# Patient Record
Sex: Female | Born: 1940
Health system: Southern US, Community
[De-identification: ages and names within clinical notes are randomized; demographics above are authoritative.]

## PROBLEM LIST (undated history)

## (undated) DIAGNOSIS — N6029 Fibroadenosis of unspecified breast: Secondary | ICD-10-CM

## (undated) DIAGNOSIS — B009 Herpesviral infection, unspecified: Secondary | ICD-10-CM

## (undated) DIAGNOSIS — M75120 Complete rotator cuff tear or rupture of unspecified shoulder, not specified as traumatic: Secondary | ICD-10-CM

## (undated) DIAGNOSIS — M7500 Adhesive capsulitis of unspecified shoulder: Secondary | ICD-10-CM

## (undated) DIAGNOSIS — M542 Cervicalgia: Secondary | ICD-10-CM

## (undated) DIAGNOSIS — R079 Chest pain, unspecified: Secondary | ICD-10-CM

## (undated) DIAGNOSIS — H6120 Impacted cerumen, unspecified ear: Secondary | ICD-10-CM

## (undated) DIAGNOSIS — G709 Myoneural disorder, unspecified: Secondary | ICD-10-CM

## (undated) DIAGNOSIS — N6011 Diffuse cystic mastopathy of right breast: Secondary | ICD-10-CM

## (undated) DIAGNOSIS — M545 Low back pain: Secondary | ICD-10-CM

## (undated) DIAGNOSIS — M25562 Pain in left knee: Secondary | ICD-10-CM

## (undated) DIAGNOSIS — M7501 Adhesive capsulitis of right shoulder: Secondary | ICD-10-CM

## (undated) DIAGNOSIS — R7303 Prediabetes: Secondary | ICD-10-CM

## (undated) DIAGNOSIS — B0229 Other postherpetic nervous system involvement: Secondary | ICD-10-CM

## (undated) DIAGNOSIS — M25511 Pain in right shoulder: Secondary | ICD-10-CM

## (undated) DIAGNOSIS — Z66 Do not resuscitate: Secondary | ICD-10-CM

## (undated) DIAGNOSIS — N6012 Diffuse cystic mastopathy of left breast: Secondary | ICD-10-CM

## (undated) DIAGNOSIS — R2681 Unsteadiness on feet: Secondary | ICD-10-CM

## (undated) DIAGNOSIS — M2170 Unequal limb length (acquired), unspecified site: Secondary | ICD-10-CM

## (undated) DIAGNOSIS — J302 Other seasonal allergic rhinitis: Secondary | ICD-10-CM

## (undated) DIAGNOSIS — J019 Acute sinusitis, unspecified: Secondary | ICD-10-CM

## (undated) DIAGNOSIS — L309 Dermatitis, unspecified: Secondary | ICD-10-CM

## (undated) HISTORY — DX: Cervicalgia: M54.2

## (undated) HISTORY — PX: ROTATOR CUFF REPAIR: SHX139

## (undated) HISTORY — DX: Fibroadenosis of unspecified breast: N60.29

## (undated) HISTORY — DX: Complete rotator cuff tear or rupture of unspecified shoulder, not specified as traumatic: M75.120

## (undated) HISTORY — DX: Adhesive capsulitis of unspecified shoulder: M75.00

## (undated) HISTORY — DX: Pain in left knee: M25.562

## (undated) HISTORY — DX: Impacted cerumen, unspecified ear: H61.20

## (undated) HISTORY — DX: Diffuse cystic mastopathy of left breast: N60.12

## (undated) HISTORY — DX: Dermatitis, unspecified: L30.9

## (undated) HISTORY — DX: Low back pain: M54.5

## (undated) HISTORY — DX: Adhesive capsulitis of right shoulder: M75.01

## (undated) HISTORY — DX: Do not resuscitate: Z66

## (undated) HISTORY — DX: Other seasonal allergic rhinitis: J30.2

## (undated) HISTORY — DX: Diffuse cystic mastopathy of right breast: N60.11

## (undated) HISTORY — DX: Chest pain, unspecified: R07.9

## (undated) HISTORY — DX: Acute sinusitis, unspecified: J01.90

## (undated) HISTORY — PX: ENDOMETRIAL BIOPSY: SHX622

## (undated) HISTORY — DX: Herpesviral infection, unspecified: B00.9

## (undated) HISTORY — DX: Other postherpetic nervous system involvement: B02.29

## (undated) HISTORY — DX: Unsteadiness on feet: R26.81

## (undated) HISTORY — DX: Unequal limb length (acquired), unspecified site: M21.70

## (undated) HISTORY — DX: Pain in right shoulder: M25.511

---
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
## 1997-01-05 HISTORY — PX: BREAST CYST ASPIRATION: SHX578

## 1997-07-03 ENCOUNTER — Encounter: Admission: RE | Admit: 1997-07-03 | Discharge: 1997-07-03 | Payer: Self-pay | Admitting: Family Medicine

## 1997-12-26 ENCOUNTER — Encounter: Admission: RE | Admit: 1997-12-26 | Discharge: 1997-12-26 | Payer: Self-pay | Admitting: Sports Medicine

## 1998-01-07 ENCOUNTER — Encounter: Admission: RE | Admit: 1998-01-07 | Discharge: 1998-01-07 | Payer: Self-pay | Admitting: Family Medicine

## 1998-07-08 ENCOUNTER — Encounter: Admission: RE | Admit: 1998-07-08 | Discharge: 1998-07-08 | Payer: Self-pay | Admitting: Family Medicine

## 1998-08-08 ENCOUNTER — Encounter: Admission: RE | Admit: 1998-08-08 | Discharge: 1998-08-08 | Payer: Self-pay | Admitting: Family Medicine

## 1998-10-29 ENCOUNTER — Encounter: Admission: RE | Admit: 1998-10-29 | Discharge: 1998-10-29 | Payer: Self-pay | Admitting: Sports Medicine

## 1999-07-15 ENCOUNTER — Encounter: Admission: RE | Admit: 1999-07-15 | Discharge: 1999-07-15 | Payer: Self-pay | Admitting: Family Medicine

## 1999-07-17 ENCOUNTER — Encounter: Payer: Self-pay | Admitting: Family Medicine

## 1999-07-17 ENCOUNTER — Encounter: Admission: RE | Admit: 1999-07-17 | Discharge: 1999-07-17 | Payer: Self-pay | Admitting: Family Medicine

## 2000-08-17 ENCOUNTER — Encounter: Admission: RE | Admit: 2000-08-17 | Discharge: 2000-08-17 | Payer: Self-pay | Admitting: Family Medicine

## 2000-11-10 ENCOUNTER — Encounter: Admission: RE | Admit: 2000-11-10 | Discharge: 2000-11-10 | Payer: Self-pay | Admitting: Family Medicine

## 2001-01-26 ENCOUNTER — Encounter: Admission: RE | Admit: 2001-01-26 | Discharge: 2001-01-26 | Payer: Self-pay | Admitting: Family Medicine

## 2001-01-31 ENCOUNTER — Encounter: Payer: Self-pay | Admitting: Family Medicine

## 2001-01-31 ENCOUNTER — Encounter: Admission: RE | Admit: 2001-01-31 | Discharge: 2001-01-31 | Payer: Self-pay | Admitting: Family Medicine

## 2001-12-09 ENCOUNTER — Encounter: Admission: RE | Admit: 2001-12-09 | Discharge: 2001-12-09 | Payer: Self-pay | Admitting: Family Medicine

## 2002-03-08 ENCOUNTER — Encounter: Admission: RE | Admit: 2002-03-08 | Discharge: 2002-03-08 | Payer: Self-pay | Admitting: Family Medicine

## 2002-03-09 ENCOUNTER — Encounter: Admission: RE | Admit: 2002-03-09 | Discharge: 2002-03-09 | Payer: Self-pay | Admitting: Family Medicine

## 2002-03-09 ENCOUNTER — Encounter: Payer: Self-pay | Admitting: Family Medicine

## 2002-03-16 ENCOUNTER — Encounter: Payer: Self-pay | Admitting: Family Medicine

## 2002-03-16 ENCOUNTER — Encounter: Admission: RE | Admit: 2002-03-16 | Discharge: 2002-03-16 | Payer: Self-pay | Admitting: Family Medicine

## 2002-09-26 ENCOUNTER — Encounter: Admission: RE | Admit: 2002-09-26 | Discharge: 2002-09-26 | Payer: Self-pay | Admitting: Family Medicine

## 2002-09-26 ENCOUNTER — Encounter: Payer: Self-pay | Admitting: Sports Medicine

## 2002-09-26 ENCOUNTER — Encounter: Admission: RE | Admit: 2002-09-26 | Discharge: 2002-09-26 | Payer: Self-pay | Admitting: Sports Medicine

## 2003-05-10 ENCOUNTER — Encounter: Admission: RE | Admit: 2003-05-10 | Discharge: 2003-05-10 | Payer: Self-pay | Admitting: Sports Medicine

## 2003-05-10 ENCOUNTER — Encounter: Admission: RE | Admit: 2003-05-10 | Discharge: 2003-05-10 | Payer: Self-pay | Admitting: Family Medicine

## 2003-09-06 ENCOUNTER — Encounter (INDEPENDENT_AMBULATORY_CARE_PROVIDER_SITE_OTHER): Payer: Self-pay | Admitting: *Deleted

## 2003-09-06 LAB — CONVERTED CEMR LAB

## 2003-09-18 ENCOUNTER — Encounter: Payer: Self-pay | Admitting: Family Medicine

## 2003-09-18 ENCOUNTER — Ambulatory Visit: Payer: Self-pay | Admitting: Family Medicine

## 2003-09-19 LAB — CONVERTED CEMR LAB: TSH: 1.53 microintl units/mL

## 2003-09-28 ENCOUNTER — Encounter: Admission: RE | Admit: 2003-09-28 | Discharge: 2003-09-28 | Payer: Self-pay | Admitting: Sports Medicine

## 2003-10-09 ENCOUNTER — Ambulatory Visit: Payer: Self-pay | Admitting: Family Medicine

## 2003-10-11 ENCOUNTER — Ambulatory Visit: Payer: Self-pay | Admitting: Family Medicine

## 2003-12-06 HISTORY — PX: COLONOSCOPY W/ BIOPSIES AND POLYPECTOMY: SHX1376

## 2003-12-07 ENCOUNTER — Ambulatory Visit: Payer: Self-pay | Admitting: Family Medicine

## 2003-12-18 ENCOUNTER — Ambulatory Visit (HOSPITAL_COMMUNITY): Admission: RE | Admit: 2003-12-18 | Discharge: 2003-12-18 | Payer: Self-pay | Admitting: Gastroenterology

## 2003-12-18 ENCOUNTER — Encounter (INDEPENDENT_AMBULATORY_CARE_PROVIDER_SITE_OTHER): Payer: Self-pay | Admitting: *Deleted

## 2004-10-14 ENCOUNTER — Ambulatory Visit: Payer: Self-pay | Admitting: Family Medicine

## 2005-11-18 ENCOUNTER — Ambulatory Visit: Payer: Self-pay | Admitting: Family Medicine

## 2006-02-09 ENCOUNTER — Other Ambulatory Visit: Admission: RE | Admit: 2006-02-09 | Discharge: 2006-02-09 | Payer: Self-pay | Admitting: Family Medicine

## 2006-02-09 ENCOUNTER — Ambulatory Visit: Payer: Self-pay | Admitting: Family Medicine

## 2006-02-09 ENCOUNTER — Encounter: Payer: Self-pay | Admitting: Family Medicine

## 2006-02-09 LAB — CONVERTED CEMR LAB
ALT: 28 units/L (ref 0–35)
AST: 24 units/L (ref 0–37)
Alkaline Phosphatase: 102 units/L (ref 39–117)
LDL Cholesterol: 117 mg/dL — ABNORMAL HIGH (ref 0–99)
Potassium: 4.4 meq/L (ref 3.5–5.3)
Sodium: 140 meq/L (ref 135–145)
Total Bilirubin: 0.5 mg/dL (ref 0.3–1.2)
Total Protein: 7.6 g/dL (ref 6.0–8.3)
Triglycerides: 244 mg/dL — ABNORMAL HIGH (ref <150)
VLDL: 49 mg/dL — ABNORMAL HIGH (ref 0–40)

## 2006-03-04 DIAGNOSIS — G609 Hereditary and idiopathic neuropathy, unspecified: Secondary | ICD-10-CM

## 2006-03-04 DIAGNOSIS — J329 Chronic sinusitis, unspecified: Secondary | ICD-10-CM | POA: Insufficient documentation

## 2006-03-04 DIAGNOSIS — N951 Menopausal and female climacteric states: Secondary | ICD-10-CM

## 2006-03-04 DIAGNOSIS — N6029 Fibroadenosis of unspecified breast: Secondary | ICD-10-CM

## 2006-03-04 DIAGNOSIS — E669 Obesity, unspecified: Secondary | ICD-10-CM | POA: Insufficient documentation

## 2006-03-04 HISTORY — DX: Fibroadenosis of unspecified breast: N60.29

## 2006-03-05 ENCOUNTER — Encounter (INDEPENDENT_AMBULATORY_CARE_PROVIDER_SITE_OTHER): Payer: Self-pay | Admitting: *Deleted

## 2006-03-31 ENCOUNTER — Encounter: Admission: RE | Admit: 2006-03-31 | Discharge: 2006-03-31 | Payer: Self-pay | Admitting: Sports Medicine

## 2006-03-31 ENCOUNTER — Encounter: Payer: Self-pay | Admitting: Family Medicine

## 2006-12-15 ENCOUNTER — Ambulatory Visit: Payer: Self-pay | Admitting: Family Medicine

## 2007-02-10 ENCOUNTER — Encounter: Payer: Self-pay | Admitting: Family Medicine

## 2007-02-10 LAB — CONVERTED CEMR LAB: Pap Smear: NORMAL

## 2007-02-15 ENCOUNTER — Telehealth: Payer: Self-pay | Admitting: Family Medicine

## 2007-04-26 ENCOUNTER — Ambulatory Visit: Payer: Self-pay | Admitting: Family Medicine

## 2007-04-26 ENCOUNTER — Encounter: Payer: Self-pay | Admitting: Family Medicine

## 2007-04-26 DIAGNOSIS — M899 Disorder of bone, unspecified: Secondary | ICD-10-CM | POA: Insufficient documentation

## 2007-04-26 DIAGNOSIS — M949 Disorder of cartilage, unspecified: Secondary | ICD-10-CM

## 2007-04-26 LAB — CONVERTED CEMR LAB
ALT: 24 units/L (ref 0–35)
AST: 20 units/L (ref 0–37)
Calcium: 9.3 mg/dL (ref 8.4–10.5)
Chloride: 104 meq/L (ref 96–112)
Creatinine, Ser: 0.6 mg/dL (ref 0.40–1.20)
HCT: 40.5 % (ref 36.0–46.0)
Platelets: 308 10*3/uL (ref 150–400)
RDW: 13.5 % (ref 11.5–15.5)
Sodium: 143 meq/L (ref 135–145)

## 2007-04-27 ENCOUNTER — Encounter: Payer: Self-pay | Admitting: Family Medicine

## 2007-05-02 ENCOUNTER — Encounter: Admission: RE | Admit: 2007-05-02 | Discharge: 2007-05-02 | Payer: Self-pay | Admitting: Family Medicine

## 2007-05-02 ENCOUNTER — Encounter: Payer: Self-pay | Admitting: Family Medicine

## 2007-05-04 ENCOUNTER — Encounter: Payer: Self-pay | Admitting: Family Medicine

## 2007-06-20 ENCOUNTER — Encounter: Admission: RE | Admit: 2007-06-20 | Discharge: 2007-06-20 | Payer: Self-pay | Admitting: Family Medicine

## 2007-08-29 ENCOUNTER — Telehealth (INDEPENDENT_AMBULATORY_CARE_PROVIDER_SITE_OTHER): Payer: Self-pay | Admitting: *Deleted

## 2007-09-14 ENCOUNTER — Telehealth: Payer: Self-pay | Admitting: Family Medicine

## 2007-09-15 ENCOUNTER — Ambulatory Visit: Payer: Self-pay | Admitting: Family Medicine

## 2007-09-15 DIAGNOSIS — B029 Zoster without complications: Secondary | ICD-10-CM | POA: Insufficient documentation

## 2007-09-19 ENCOUNTER — Telehealth: Payer: Self-pay | Admitting: Family Medicine

## 2007-09-26 ENCOUNTER — Encounter: Payer: Self-pay | Admitting: Family Medicine

## 2007-10-04 ENCOUNTER — Telehealth: Payer: Self-pay | Admitting: Family Medicine

## 2007-11-10 ENCOUNTER — Ambulatory Visit: Payer: Self-pay | Admitting: Family Medicine

## 2007-11-10 DIAGNOSIS — B0229 Other postherpetic nervous system involvement: Secondary | ICD-10-CM

## 2007-11-10 DIAGNOSIS — S93409A Sprain of unspecified ligament of unspecified ankle, initial encounter: Secondary | ICD-10-CM | POA: Insufficient documentation

## 2007-11-10 HISTORY — DX: Other postherpetic nervous system involvement: B02.29

## 2007-11-10 LAB — CONVERTED CEMR LAB
Bilirubin Urine: NEGATIVE
Ketones, urine, test strip: NEGATIVE
Protein, U semiquant: NEGATIVE
Urobilinogen, UA: 0.2

## 2007-11-14 ENCOUNTER — Encounter: Payer: Self-pay | Admitting: Family Medicine

## 2008-12-18 ENCOUNTER — Ambulatory Visit: Payer: Self-pay | Admitting: Family Medicine

## 2009-03-14 ENCOUNTER — Encounter: Admission: RE | Admit: 2009-03-14 | Discharge: 2009-03-14 | Payer: Self-pay | Admitting: Family Medicine

## 2009-04-24 ENCOUNTER — Encounter: Payer: Self-pay | Admitting: Family Medicine

## 2010-01-26 ENCOUNTER — Encounter: Payer: Self-pay | Admitting: Family Medicine

## 2010-02-04 NOTE — Consult Note (Signed)
[facility]  [facility]   Imported By: Clydell Hakim 05/02/2009 14:29:04  _____________________________________________________________________  External Attachment:    Type:   Image     Comment:   External Document  Appended Document: [facility]    Clinical Lists Changes  Observations: Added new observation of COLONRECACT: Repeat colonoscopy in 5 years.  (04/09/2009 12:06) Added new observation of COLONOSCOPY: Results: Hemorrhoids.     Results: Diverticulosis.       Location:  [facility]. Dr Ewing Schlein   (04/09/2009 12:06)       Colonoscopy  Procedure date:  04/09/2009  Findings:      Results: Hemorrhoids.     Results: Diverticulosis.       Location:  [facility]. Dr Ewing Schlein    Comments:      Repeat colonoscopy in 5 years.    Colonoscopy  Procedure date:  04/09/2009  Findings:      Results: Hemorrhoids.     Results: Diverticulosis.       Location:  [facility]. Dr Ewing Schlein    Comments:      Repeat colonoscopy in 5 years.

## 2010-03-17 ENCOUNTER — Encounter: Payer: Self-pay | Admitting: Home Health Services

## 2010-05-23 NOTE — Op Note (Signed)
NAMEANSLEY, MANGIAPANE              ACCOUNT NO.:  [id number]   MEDICAL RECORD NO.:  [id number]          PATIENT TYPE:  AMB   LOCATION:  ENDO                         FACILITY:  [facility]   PHYSICIAN:  Petra Kuba, M.D.    DATE OF BIRTH:  1940/11/18   DATE OF PROCEDURE:  12/18/2003  DATE OF DISCHARGE:                                 OPERATIVE REPORT   PROCEDURE:  Colonoscopy with polypectomy.   INDICATION:  Some mild change in bowel habits.  Due for colonic screening.  Consent was signed after risks, benefits, methods, options thoroughly  discussed in the office.   MEDICINES USED:  1.  Demerol 50.  2.  Versed 4.   DESCRIPTION OF PROCEDURE:  Rectal inspection was pertinent for external  hemorrhoids.  Digital exam was negative.  Video pediatric adjustable  colonoscope was inserted and with abdominal pressure easily advanced around  the colon to the cecum.  On insertion, small descending polyp was seen as  well as some left-sided diverticula but no other abnormalities.  The cecum  was identified by the appendiceal orifice and the ileocecal valve.  The  scope was slowly withdrawn.  The prep was adequate.  There was some liquid  stool that required washing and suctioning.  On slow withdrawal through the  colon, the cecum, ascending, and transverse was normal.  As the scope was  withdrawn around the left side of the colon, occasional scattered  diverticula were seen.  Polyp seen on insertion was found, snared x 2.  There still seemed to be a little residual polypoid tissue remaining, so it  was hot biopsied x 1.  Pieces removed were suctioned through the scope and  collected in the trap.  The scope was further withdrawn.  One other tiny  sigmoid polyp was seen and was hot biopsied x 1 and put in the same  container.  Once back in the rectum, anorectal pull-through and retroflexion  confirmed some small hemorrhoids.  The scope was straightened and readvanced  a short ways up the left side of  the colon; air was suctioned, scope  removed.  The patient tolerated the procedure well.  There was no obvious  immediate complication.   ENDOSCOPIC DIAGNOSES:  1.  Internal/external hemorrhoids.  2.  Left-sided scattered diverticula.  3.  One tiny sigmoid polyp, hot biopsied.  4.  One small descending polyp, snared x 2 and hot biopsied x 1.  5.  Otherwise, within normal limits to the cecum.   PLAN:  1.  Await pathology to determine future colonic screening.  Probably recheck      in 5 years.  2.  Happy to see back p.r.n.  3.  Otherwise, return to Dr. Sheffield Slider for the customary health care maintenance      to include yearly rectals and guaiacs.      MEM/MEDQ  D:  12/18/2003  T:  12/18/2003  Job:  [id number]   cc:   Deniece Portela A. Sheffield Slider, M.D.  Fax: [phone]

## 2010-05-31 ENCOUNTER — Other Ambulatory Visit: Payer: Self-pay | Admitting: Family Medicine

## 2010-06-02 NOTE — Telephone Encounter (Signed)
Refill request

## 2010-06-29 ENCOUNTER — Other Ambulatory Visit: Payer: Self-pay | Admitting: Family Medicine

## 2010-06-29 NOTE — Telephone Encounter (Signed)
Refill request

## 2010-08-27 ENCOUNTER — Other Ambulatory Visit: Payer: Self-pay | Admitting: Family Medicine

## 2010-08-27 NOTE — Telephone Encounter (Signed)
Refill request

## 2011-06-20 ENCOUNTER — Other Ambulatory Visit: Payer: Self-pay | Admitting: Family Medicine

## 2011-07-14 ENCOUNTER — Encounter: Payer: Self-pay | Admitting: Family Medicine

## 2011-07-29 ENCOUNTER — Other Ambulatory Visit: Payer: Self-pay | Admitting: Family Medicine

## 2011-07-30 NOTE — Telephone Encounter (Signed)
She needs an office visit before further refills

## 2011-08-04 ENCOUNTER — Encounter: Payer: Self-pay | Admitting: Family Medicine

## 2011-08-04 ENCOUNTER — Ambulatory Visit (INDEPENDENT_AMBULATORY_CARE_PROVIDER_SITE_OTHER): Payer: Medicare Other | Admitting: Family Medicine

## 2011-08-04 VITALS — BP 143/85 | HR 81 | Ht 62.0 in | Wt 164.0 lb

## 2011-08-04 DIAGNOSIS — B0229 Other postherpetic nervous system involvement: Secondary | ICD-10-CM

## 2011-08-04 DIAGNOSIS — M25562 Pain in left knee: Secondary | ICD-10-CM

## 2011-08-04 DIAGNOSIS — M1712 Unilateral primary osteoarthritis, left knee: Secondary | ICD-10-CM

## 2011-08-04 DIAGNOSIS — M25519 Pain in unspecified shoulder: Secondary | ICD-10-CM

## 2011-08-04 DIAGNOSIS — M899 Disorder of bone, unspecified: Secondary | ICD-10-CM

## 2011-08-04 DIAGNOSIS — M25569 Pain in unspecified knee: Secondary | ICD-10-CM

## 2011-08-04 DIAGNOSIS — M949 Disorder of cartilage, unspecified: Secondary | ICD-10-CM

## 2011-08-04 HISTORY — DX: Unilateral primary osteoarthritis, left knee: M17.12

## 2011-08-04 HISTORY — DX: Pain in left knee: M25.562

## 2011-08-04 MED ORDER — IBUPROFEN 600 MG PO TABS: 600.0000 mg | ORAL_TABLET | Freq: Three times a day (TID) | ORAL | Status: AC | PRN

## 2011-08-04 MED ORDER — GABAPENTIN 600 MG PO TABS: 600.0000 mg | ORAL_TABLET | Freq: Three times a day (TID) | ORAL | Status: DC

## 2011-08-04 MED ORDER — GABAPENTIN 800 MG PO TABS: 800.0000 mg | ORAL_TABLET | Freq: Every day | ORAL | Status: DC

## 2011-08-04 NOTE — Patient Instructions (Addendum)
Please call to arrange xrays of your knee if it isn't improved after the Ibuprofen course  Schedule fasting lab tests. I'll let you know the results.   Schedule an annual Medicare evaluation with Rosalita Chessman.

## 2011-08-05 ENCOUNTER — Encounter: Payer: Self-pay | Admitting: Family Medicine

## 2011-08-05 NOTE — Assessment & Plan Note (Addendum)
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 I will send her an article about stretching exercises for this syndrome Ibuprofen course. If no improvement, iliotibial band exercises

## 2011-08-05 NOTE — Progress Notes (Signed)
  Subjective:    Patient ID: SCARLET ABAD, female    DOB: 1940/01/23, 71 y.o.   MRN: [id number]  HPI left knee - hurting lateral side x 4 weeks. No trauma or overuse. Worse with squatting or doing steps  right shoulder - hurting with movement last 3 months. No history of overuse or injury. Doesn't awaken her at night.   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   Peripheral neuropathy - continues discomfort Review of Systems     Objective:   Physical Exam  Cardiovascular: Normal rate and regular rhythm.   Pulmonary/Chest: Effort normal and breath sounds normal.  Musculoskeletal:       left knee tender proximal to anterolateral joint line and beneath both sides of patella. No crepitus or locking. No effusion. Pain reproduced with full flexion, but McMurray's is negative.   right shoulder has full range of motion without pain. Hawkins positive for pain and tender over right AC joint. No biceps or subacromial tenderness and negative impingement signs          Assessment & Plan:

## 2011-08-05 NOTE — Assessment & Plan Note (Signed)
Mild currently. Return for steroid injection if not improved on Ibuprofen

## 2011-08-06 ENCOUNTER — Other Ambulatory Visit: Payer: Self-pay | Admitting: Family Medicine

## 2011-08-06 ENCOUNTER — Encounter: Payer: Self-pay | Admitting: Home Health Services

## 2011-08-06 ENCOUNTER — Ambulatory Visit (INDEPENDENT_AMBULATORY_CARE_PROVIDER_SITE_OTHER): Payer: Medicare Other | Admitting: Home Health Services

## 2011-08-06 VITALS — BP 127/82 | HR 68 | Temp 97.9°F | Ht 61.0 in | Wt 164.2 lb

## 2011-08-06 DIAGNOSIS — Z23 Encounter for immunization: Secondary | ICD-10-CM

## 2011-08-06 DIAGNOSIS — Z0000 Encounter for general adult medical examination without abnormal findings: Secondary | ICD-10-CM

## 2011-08-06 NOTE — Assessment & Plan Note (Signed)
Tolerable on the Gabapentin

## 2011-08-06 NOTE — Assessment & Plan Note (Signed)
Will schedule BMD

## 2011-08-06 NOTE — Progress Notes (Signed)
Patient here for annual wellness visit, patient reports: Risk Factors/Conditions needing evaluation or treatment: Pt has no new risk factors that need's evaluation.  Home Safety: Pt lives with husband in 3 story home. Pt reports having smoke detectors and adaptive equipment in bathroom. Other Information: Corrective lens: Pt wears daily corrective lens.  Visits eye dr annually. Dentures: Pt does not have dentures. Visits dentist annually. Memory: Pt denies memory problems. Patient's Mini Mental Score (recorded in doc. flowsheet): 29  Balance/Gait: Pt does not have any noticeable impairment.  Balance Abnormal Patient value  Sitting balance    Sit to stand    Attempts to arise    Immediate standing balance    Standing balance    Nudge    Eyes closed- Romberg    Tandem stance    Back lean    Neck Rotation    360 degree turn    Sitting down     Gait Abnormal Patient value  Initiation of gait    Step length-left    Step length-right    Step height-left    Step height-right    Step symmetry    Step continuity    Path deviation    Trunk movement    Walking stance        Annual Wellness Visit Requirements Recorded Today In  Medical, family, social history Past Medical, Family, Social History Section  Current providers Care team  Current medications Medications  Wt, BP, Ht, BMI Vital signs  Hearing assessment (welcome visit) Hearing/vision.   Tobacco, alcohol, illicit drug use History  ADL Nurse Assessment  Depression Screening Nurse Assessment  Cognitive impairment Nurse Assessment  Mini Mental Status Document Flowsheet  Fall Risk Nurse Assessment  Home Safety Progress Note  End of Life Planning (welcome visit) Social Documentation  Medicare preventative services Progress Note  Risk factors/conditions needing evaluation/treatment Progress Note  Personalized health advice Patient Instructions, goals, letter  Diet & Exercise Social Documentation  Emergency Contact  Social Documentation  Seat Belts Social Documentation  Sun exposure/protection Social Documentation    Prevention Plan:   Recommended Medicare Prevention Screenings Women over 65 Test For Frequency Date of Last- BOLD if needed  Breast Cancer 1-2 yrs 3/11- discussed scheduling appt  Cervical Cancer 1-3 yrs NI-due to age  Colorectal Cancer 1-10 yrs 4/05  Osteoporosis once 4/09- will schedule re-visit  Cholesterol 5 yrs 4/09- is scheduled to come back  Diabetes yearly 4/09-is scheduled to come back  HIV yearly declined  Influenza Shot yearly 12/10- will come back in the fall  Pneumonia Shot once 11/05- was done today   Zostavax Shot once discussed, will talk to pharmacist   I have reviewed this visit and discussed with Arlys John and agree with her documentation. Wayne A. Sheffield Slider, MD

## 2011-08-06 NOTE — Patient Instructions (Signed)
1. Schedule mammogram. 2. Schedule bone dentist screening. 3. Consider getting shingles vaccine. 4. Follow up with getting blood work done here at [facility]. 5. Be sure to get your flu vaccine starting in September 2013.  6. Consider focusing in eating 3-5 fruits and vegetables a day. 7. Consider looking into Silver Sneakers program at [facility] for exercise.

## 2011-08-07 ENCOUNTER — Other Ambulatory Visit: Payer: Self-pay | Admitting: Family Medicine

## 2011-08-07 ENCOUNTER — Telehealth: Payer: Self-pay | Admitting: Family Medicine

## 2011-08-07 ENCOUNTER — Encounter: Payer: Self-pay | Admitting: Home Health Services

## 2011-08-07 DIAGNOSIS — Z1231 Encounter for screening mammogram for malignant neoplasm of breast: Secondary | ICD-10-CM

## 2011-08-07 DIAGNOSIS — M8580 Other specified disorders of bone density and structure, unspecified site: Secondary | ICD-10-CM

## 2011-08-07 NOTE — Telephone Encounter (Signed)
Patient needs an [facility]  Her appt is for 8/15 so they will need it by then.

## 2011-08-07 NOTE — Telephone Encounter (Signed)
Done. Faxed. .Kynnedy Carreno  

## 2011-08-20 ENCOUNTER — Ambulatory Visit
Admission: RE | Admit: 2011-08-20 | Discharge: 2011-08-20 | Disposition: A | Discharge status: Home / Self Care | Payer: Medicare Other | Source: Ambulatory Visit | Attending: Family Medicine | Admitting: Family Medicine

## 2011-08-20 ENCOUNTER — Encounter: Payer: Self-pay | Admitting: Family Medicine

## 2011-08-20 ENCOUNTER — Ambulatory Visit (INDEPENDENT_AMBULATORY_CARE_PROVIDER_SITE_OTHER): Payer: Medicare Other | Admitting: Family Medicine

## 2011-08-20 VITALS — BP 114/76 | HR 81 | Ht 61.0 in | Wt 164.0 lb

## 2011-08-20 DIAGNOSIS — M899 Disorder of bone, unspecified: Secondary | ICD-10-CM

## 2011-08-20 DIAGNOSIS — E781 Pure hyperglyceridemia: Secondary | ICD-10-CM

## 2011-08-20 DIAGNOSIS — B009 Herpesviral infection, unspecified: Secondary | ICD-10-CM | POA: Insufficient documentation

## 2011-08-20 DIAGNOSIS — M25562 Pain in left knee: Secondary | ICD-10-CM

## 2011-08-20 DIAGNOSIS — L988 Other specified disorders of the skin and subcutaneous tissue: Secondary | ICD-10-CM

## 2011-08-20 DIAGNOSIS — M8580 Other specified disorders of bone density and structure, unspecified site: Secondary | ICD-10-CM

## 2011-08-20 DIAGNOSIS — R238 Other skin changes: Secondary | ICD-10-CM

## 2011-08-20 DIAGNOSIS — Z1231 Encounter for screening mammogram for malignant neoplasm of breast: Secondary | ICD-10-CM

## 2011-08-20 DIAGNOSIS — M949 Disorder of cartilage, unspecified: Secondary | ICD-10-CM

## 2011-08-20 DIAGNOSIS — M25569 Pain in unspecified knee: Secondary | ICD-10-CM

## 2011-08-20 DIAGNOSIS — M25519 Pain in unspecified shoulder: Secondary | ICD-10-CM

## 2011-08-20 HISTORY — DX: Pure hyperglyceridemia: E78.1

## 2011-08-20 HISTORY — DX: Herpesviral infection, unspecified: B00.9

## 2011-08-20 LAB — CBC
HCT: 39.6 % (ref 36.0–46.0)
MCV: 87 fL (ref 78.0–100.0)
Platelets: 294 10*3/uL (ref 150–400)
RBC: 4.55 MIL/uL (ref 3.87–5.11)
WBC: 8.9 10*3/uL (ref 4.0–10.5)

## 2011-08-20 MED ORDER — GABAPENTIN 600 MG PO TABS: 600.0000 mg | ORAL_TABLET | Freq: Four times a day (QID) | ORAL | Status: DC

## 2011-08-20 NOTE — Assessment & Plan Note (Signed)
Will check Vit D today, Last reading was 28 in 2009. Pt scheduled for DEXA today

## 2011-08-20 NOTE — Assessment & Plan Note (Addendum)
Last labs in 2008 showed TG of 244.  Will recheck lipid profile at this timing.  Will go over results with pt at next visit

## 2011-08-20 NOTE — Patient Instructions (Addendum)
Please go to the hospital to have your X-ray of your knee done.  Please go to physical therapy for increasing the strength of your leg to help with your knee pain.  We will get a culture of the bumps on your arm and let you know the results of this.  Please follow up with [place] in 1-2 months to see how you are doing.   Patellofemoral Syndrome If you have had pain in the front of your knee for a long time, chances are good that you have patellofemoral syndrome. The word patella refers to the kneecap. Femoral (or femur) refers to the thigh bone. That is the bone the kneecap sits on. The kneecap is shaped like a triangle. Its job is to protect the knee and to improve the efficiency of your thigh muscles (quadriceps). The underside of the kneecap is made of smooth tissue (cartilage). This lets the kneecap slide up and down as the knee moves. Sometimes this cartilage becomes soft. Your healthcare provider may say the cartilage breaks down. That is patellofemoral syndrome. It can affect one knee, or both. The condition is sometimes called patellofemoral pain syndrome. That is because the condition is painful. The pain usually gets worse with activity. Sitting for a long time with the knee bent also makes the pain worse. It usually gets better with rest and proper treatment. CAUSES  No one is sure why some people develop this problem and others do not. Runners often get it. One name for the condition is "runner's knee." However, some people run for years and never have knee pain. Certain things seem to make patellofemoral syndrome more likely. They include:  Moving out of alignment. The kneecap is supposed to move in a straight line when the thigh muscle pulls on it. Sometimes the kneecap moves in poor alignment. That can make the knee swell and hurt. Some experts believe it also wears down the cartilage.   Injury to the kneecap.   Strain on the knee. This may occur during sports activity. Soccer, running,  skiing and cycling can put excess stress on the knee.   Being flat-footed or knock-kneed.  SYMPTOMS   Knee pain.   Pain under the kneecap. This is usually a dull, aching pain.   Pain in the knee when doing certain things: squatting, kneeling, going up or down stairs.   Pain in the knee when you stand up after sitting down for awhile.   Tightness in the knee.   Loss of muscle strength in the thigh.   Swelling of the knee.  DIAGNOSIS  Healthcare providers often send people with knee pain to an orthopedic caregiver. This person has special training to treat problems with bones and joints. To decide what is causing your knee pain, your caregiver will probably:  Do a physical exam. This will probably include:   Asking about symptoms you have noticed.   Asking about your activities and any injuries.   Feeling your knee. Moving it. This will help test the knee's strength. It will also check alignment (whether the knee and leg are aligned normally).   Order some tests, such as:   Imaging tests. They create pictures of the inside of the knee. Tests may include:   X-rays.   Computed tomography (CT) scan. This uses X-rays and a computer to show more detail.   Magnetic resonance imaging (MRI). This test uses magnets, radio waves and a computer to make pictures.  TREATMENT   Medication is almost always used  first. It can relieve pain. It also can reduce swelling. Non-steroidal anti-inflammatory medicines (called NSAIDs) are usually suggested. Sometimes a stronger form is needed. A stronger form would require a prescription.   Other treatment may be needed after the swelling goes down. Possibilities include:   Exercise. Certain exercises can make the muscles around the knee stronger which decreases the pressure on the knee cap. This includes the thigh muscle. Certain exercises also may be suggested to increase your flexibility.   A knee brace. This gives the knee extra support and  helps align the movement of the knee cap.   Orthotics. These are special shoe inserts. They can help keep your leg and knee aligned.   Surgery is sometimes needed. This is rare. Options include:   Arthroscopy. The surgeon uses a special tool to remove any damaged pieces of the kneecap. Only a few small incisions (cuts) are needed.   Realignment. This is open surgery. The goals are to reduce pressure and fix the way the kneecap moves.  HOME CARE INSTRUCTIONS   Take any medication prescribed by your healthcare provider. Follow the directions carefully.   If your knee is swollen:   Put ice or cold packs on it. Do this for 20 to 30 minutes, 3 to 4 times a day.   Keep the knee raised. Make sure it is supported. Put a pillow under it.   Rest your knee. For example, take the elevator instead of the stairs for awhile. Or, take a break from sports activity that strain your knee. Try walking or swimming instead.   Whenever you are active:   Use an elastic bandage on your knee. This gives it support.   After any activity, put ice or cold packs on your knees. Do this for about 10 to 20 minutes.   Make sure you wear shoes that give good support. Make sure they are not worn down. The heels should not slant in or out.  SEEK MEDICAL CARE IF:   Knee pain gets worse. Or it does not go away, even after taking pain medicine.   Swelling does not go down.   Your thigh muscle becomes weak.   You have an oral temperature above 102 F (38.9 C).  SEEK IMMEDIATE MEDICAL CARE IF:  You have an oral temperature above 102 F (38.9 C), not controlled by medicine. Document Released: 12/10/2008 Document Revised: 12/11/2010 Document Reviewed: 12/10/2008 [facility] Patient Information 2012 [place], [place].

## 2011-08-20 NOTE — Assessment & Plan Note (Signed)
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 Culture sent of vesicle fluid

## 2011-08-20 NOTE — Progress Notes (Signed)
Teresa Shaffer is a 71 y.o. female who presents to clinic today for L knee pain along with L hip pain.  Her left knee pain started a couple months ago, notices it more of a dull achy pain with sharp exacerbations when she ambulates up and down the stairs or rising from sitting for an extended time.  Denies trauma to the knee, weakness, numbness, burning, swelling in her knee as well.  Pt has been seen for R AC joint discomfort as well in the past.  States her ROM is normal and only has pain when moving her arm across her body.  Pain is sharp during that timeframe and otherwise is not having pain at rest.  Denies weakness, swelling, burning or numbness.  Last pt is c/o L arm vesicles with pain that have reoccurred 3 x over the past several months.  Notices burning and pain that precedes the appearance of vesicles on her L arm.  Pt does have Hx of Zoster around 6 yrs ago.  Is on gabapentin 600 tid along with 800 mg qhs.  The pain is in the lower abdomen  Denies lightheaded, N/V/D, CP, SOB, edema, changes in vision, weight loss, nightsweats, fever, fatigue, changes in mood, energy.    PMH reviewed.  History  Substance Use Topics  . Smoking status: Never Smoker   . Smokeless tobacco: Never Used  . Alcohol Use: 0.0 oz/week    0 drink(s) per week     rare alcohol  FHx reviewed Meds reviewed   ROS as above otherwise neg   Exam:  BP 114/76  Pulse 81  Ht 5\' 1"  (1.549 m)  Wt 164 lb (74.39 kg)  BMI 30.99 kg/m2 Gen: Well NAD, Well nourished, well developed HEENT: [place]/AT, EOMI B/L  Psych: AAO x 3 Neuro: +2 patellar reflex B/L, MS 5/5 B/L LE Cardio: RRR, No M/G/R Pulm: CTAB, no wheezing Abd: NABS, Non distended, nontender  Shoulder: ROM normal B/L, pain with crossover maneuver and TTP at [facility] joint on R  Knee:  Exam: General (compare with less affected knee)  1. Observation  1. Ecchymosis no 2. Knee Effusion with obscured landmarks  1. See Ballottable Patella Sign no 2. See Knee Bulge  Sign no 3. Previous surgical scars no 4. Knee resting position 5. Quadriceps muscle atrophy  1. Evaluate Vastus Medialis Obliquus specifically no 2. Atrophy often on side of ligamentous injury  2. Tenderness to Palpation  1. Patella yes 2. Tibial tubercle no 3. Patellar tendon no 4. Quadriceps tendon no 5. Joint line  1. Medial  no 2. Lateral no 3. Normal Range of Motion  1. Flexion: 135 degrees 2. Extension: 0 to 10 degrees above horizontal plane Exam: Patellofemoral  1. Quadriceps Femoris Muscle Angle (Q Angle) - >15 on L side 2. Patella tracking with quadriceps contraction  yes L side 1. Crepitus on L side as well  3. Patellar Apprehension Test no Exam: Anterior Cruciate Ligament (ACL) Stability Tests  1. Lachman Test (most sensitive) no 2. Knee Anterior Drawer Test no Pivot Shift Test [facility] Test) not perfromed   Exam: Collateral ligament evaluation  1. Knee Valgus Stress Test (Medial collateral ligament) no 2. Knee Varus Stress Test (Lateral collateral ligament) no Exam: Meniscus Evaluation  1. McMurray's Test no 2. Joint line tenderness is 76% sensitive for meniscal tear, but not specific -  no Exam: Standing evaluation  1. Balanced weight on each leg 2. Genu Varum L side > 15 degrees, R side 10-15 3. Hip,  Knee, and ankle alignmeny

## 2011-08-20 NOTE — Assessment & Plan Note (Signed)
Pain with elevating from chair, up and down stairs, but no pain at joint line.  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  Recommended Tylenol 650 bid.

## 2011-08-20 NOTE — Assessment & Plan Note (Signed)
Has pain at [facility] joint but does not want injection at this point.

## 2011-08-21 ENCOUNTER — Telehealth: Payer: Self-pay | Admitting: *Deleted

## 2011-08-21 ENCOUNTER — Encounter: Payer: Self-pay | Admitting: Family Medicine

## 2011-08-21 ENCOUNTER — Ambulatory Visit (HOSPITAL_COMMUNITY)
Admission: RE | Admit: 2011-08-21 | Discharge: 2011-08-21 | Disposition: A | Discharge status: Home / Self Care | Payer: Medicare Other | Source: Ambulatory Visit | Attending: Family Medicine | Admitting: Family Medicine

## 2011-08-21 DIAGNOSIS — M25569 Pain in unspecified knee: Secondary | ICD-10-CM | POA: Insufficient documentation

## 2011-08-21 DIAGNOSIS — M25562 Pain in left knee: Secondary | ICD-10-CM

## 2011-08-21 LAB — LIPID PANEL
HDL: 41 mg/dL (ref >39)
LDL Cholesterol: 116 mg/dL — ABNORMAL HIGH (ref 0–99)
Total CHOL/HDL Ratio: 5.1 Ratio

## 2011-08-21 LAB — COMPREHENSIVE METABOLIC PANEL
CO2: 26 mEq/L (ref 19–32)
Creat: 0.63 mg/dL (ref 0.50–1.10)
Glucose, Bld: 89 mg/dL (ref 70–99)
Total Bilirubin: 0.5 mg/dL (ref 0.3–1.2)

## 2011-08-21 MED ORDER — DICLOFENAC SODIUM 1 % TD GEL: 1.0000 "application " | Freq: Four times a day (QID) | TRANSDERMAL | Status: DC

## 2011-08-21 NOTE — Addendum Note (Signed)
Addended by: Zachery Dauer on: 08/21/2011 12:17 PM   Modules accepted: Orders

## 2011-08-21 NOTE — Telephone Encounter (Signed)
I called and left a message about her lab results and knee xrays and that I had sent in the cream.

## 2011-08-21 NOTE — Telephone Encounter (Signed)
Patient came by office after having knee xray . States Dr. Sheffield Slider talked about a cream for knee that has some healing aspects and now she would like to have an RX for this.  She has been using BioFreeze. Pharmacy is  [facility]. Will forward to Dr. Sheffield Slider.

## 2011-08-24 ENCOUNTER — Other Ambulatory Visit: Payer: Self-pay | Admitting: Family Medicine

## 2011-08-24 DIAGNOSIS — R928 Other abnormal and inconclusive findings on diagnostic imaging of breast: Secondary | ICD-10-CM

## 2011-08-27 ENCOUNTER — Ambulatory Visit: Payer: Medicare Other | Attending: Family Medicine | Admitting: Physical Therapy

## 2011-08-27 DIAGNOSIS — M25569 Pain in unspecified knee: Secondary | ICD-10-CM | POA: Insufficient documentation

## 2011-08-27 DIAGNOSIS — IMO0001 Reserved for inherently not codable concepts without codable children: Secondary | ICD-10-CM | POA: Insufficient documentation

## 2011-08-27 DIAGNOSIS — M25669 Stiffness of unspecified knee, not elsewhere classified: Secondary | ICD-10-CM | POA: Insufficient documentation

## 2011-08-31 ENCOUNTER — Ambulatory Visit: Payer: Medicare Other

## 2011-08-31 ENCOUNTER — Telehealth: Payer: Self-pay | Admitting: Family Medicine

## 2011-08-31 NOTE — Telephone Encounter (Signed)
Abnormal mammogram. Lt mass, Rt calcs  Form signed for ultrasound guided Core bx

## 2011-09-01 ENCOUNTER — Encounter: Payer: Medicare Other | Admitting: Physical Therapy

## 2011-09-02 ENCOUNTER — Ambulatory Visit
Admission: RE | Admit: 2011-09-02 | Discharge: 2011-09-02 | Disposition: A | Discharge status: Home / Self Care | Payer: Medicare Other | Source: Ambulatory Visit | Attending: Family Medicine | Admitting: Family Medicine

## 2011-09-02 ENCOUNTER — Other Ambulatory Visit: Payer: Self-pay | Admitting: Family Medicine

## 2011-09-02 DIAGNOSIS — R928 Other abnormal and inconclusive findings on diagnostic imaging of breast: Secondary | ICD-10-CM

## 2011-09-03 ENCOUNTER — Ambulatory Visit: Payer: Medicare Other | Admitting: Physical Therapy

## 2011-09-04 ENCOUNTER — Encounter: Payer: Self-pay | Admitting: Family Medicine

## 2011-09-08 ENCOUNTER — Ambulatory Visit: Payer: Medicare Other | Attending: Family Medicine | Admitting: Physical Therapy

## 2011-09-08 DIAGNOSIS — IMO0001 Reserved for inherently not codable concepts without codable children: Secondary | ICD-10-CM | POA: Insufficient documentation

## 2011-09-08 DIAGNOSIS — M25669 Stiffness of unspecified knee, not elsewhere classified: Secondary | ICD-10-CM | POA: Insufficient documentation

## 2011-09-08 DIAGNOSIS — M25569 Pain in unspecified knee: Secondary | ICD-10-CM | POA: Insufficient documentation

## 2011-09-09 ENCOUNTER — Encounter: Payer: Medicare Other | Admitting: Physical Therapy

## 2011-09-10 ENCOUNTER — Ambulatory Visit: Payer: Medicare Other | Admitting: Physical Therapy

## 2011-09-15 ENCOUNTER — Ambulatory Visit: Payer: Medicare Other | Admitting: Physical Therapy

## 2011-09-17 ENCOUNTER — Ambulatory Visit: Payer: Medicare Other | Admitting: Physical Therapy

## 2011-09-22 ENCOUNTER — Ambulatory Visit: Payer: Medicare Other | Admitting: Physical Therapy

## 2011-09-23 ENCOUNTER — Ambulatory Visit: Payer: Medicare Other | Admitting: Physical Therapy

## 2011-09-24 ENCOUNTER — Encounter: Payer: Medicare Other | Admitting: Physical Therapy

## 2011-09-28 ENCOUNTER — Encounter: Payer: Medicare Other | Admitting: Physical Therapy

## 2011-09-29 ENCOUNTER — Encounter: Payer: Medicare Other | Admitting: Physical Therapy

## 2011-09-30 ENCOUNTER — Ambulatory Visit: Payer: Medicare Other | Admitting: Physical Therapy

## 2011-10-01 ENCOUNTER — Encounter: Payer: Medicare Other | Admitting: Physical Therapy

## 2011-10-05 ENCOUNTER — Ambulatory Visit: Payer: Medicare Other | Admitting: Physical Therapy

## 2011-10-08 ENCOUNTER — Ambulatory Visit: Payer: Medicare Other | Attending: Family Medicine | Admitting: Physical Therapy

## 2011-10-08 DIAGNOSIS — M25569 Pain in unspecified knee: Secondary | ICD-10-CM | POA: Insufficient documentation

## 2011-10-08 DIAGNOSIS — IMO0001 Reserved for inherently not codable concepts without codable children: Secondary | ICD-10-CM | POA: Insufficient documentation

## 2011-10-08 DIAGNOSIS — M25669 Stiffness of unspecified knee, not elsewhere classified: Secondary | ICD-10-CM | POA: Insufficient documentation

## 2011-10-13 ENCOUNTER — Ambulatory Visit: Payer: Medicare Other | Admitting: Physical Therapy

## 2011-10-15 ENCOUNTER — Ambulatory Visit: Payer: Medicare Other

## 2011-10-19 ENCOUNTER — Ambulatory Visit: Payer: Medicare Other | Admitting: Physical Therapy

## 2011-10-22 ENCOUNTER — Ambulatory Visit: Payer: Medicare Other | Admitting: Physical Therapy

## 2011-10-27 ENCOUNTER — Ambulatory Visit: Payer: Medicare Other | Admitting: Physical Therapy

## 2011-10-29 ENCOUNTER — Ambulatory Visit: Payer: Medicare Other | Admitting: Physical Therapy

## 2011-11-02 ENCOUNTER — Telehealth: Payer: Self-pay | Admitting: Family Medicine

## 2011-11-02 NOTE — Telephone Encounter (Signed)
Discharged from PT, goals for knee pain treatment met

## 2011-11-10 ENCOUNTER — Encounter: Payer: Self-pay | Admitting: Family Medicine

## 2011-11-10 ENCOUNTER — Ambulatory Visit (INDEPENDENT_AMBULATORY_CARE_PROVIDER_SITE_OTHER): Payer: Medicare Other | Admitting: Family Medicine

## 2011-11-10 VITALS — BP 130/72 | HR 86 | Ht 61.0 in | Wt 165.0 lb

## 2011-11-10 DIAGNOSIS — M25569 Pain in unspecified knee: Secondary | ICD-10-CM

## 2011-11-10 DIAGNOSIS — E669 Obesity, unspecified: Secondary | ICD-10-CM

## 2011-11-10 DIAGNOSIS — M542 Cervicalgia: Secondary | ICD-10-CM

## 2011-11-10 DIAGNOSIS — Z23 Encounter for immunization: Secondary | ICD-10-CM

## 2011-11-10 DIAGNOSIS — M25562 Pain in left knee: Secondary | ICD-10-CM

## 2011-11-10 DIAGNOSIS — B0229 Other postherpetic nervous system involvement: Secondary | ICD-10-CM

## 2011-11-10 HISTORY — DX: Cervicalgia: M54.2

## 2011-11-10 NOTE — Assessment & Plan Note (Signed)
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 She'll take another 5 days the Aleve in addition to applying heat and stretching and local massage. I recommended she sleep in her back with support pillow if the problem persists.

## 2011-11-10 NOTE — Patient Instructions (Addendum)
We will contact you about the sports medicine visit. Not the week of November 11-15 when you are on vacation  Take the Alleve twice daily for the next 5 days to help your neck and your knee.

## 2011-11-10 NOTE — Assessment & Plan Note (Signed)
Likely DJD. She would like to avoid an injection. I referred her to sports medicine clinic for evaluation the knee and feet. Ultrasound may be indicated for further vibration of an

## 2011-11-10 NOTE — Assessment & Plan Note (Signed)
No success losing weight.

## 2011-11-10 NOTE — Assessment & Plan Note (Signed)
Well controlled on current gabapentin regemine

## 2011-11-10 NOTE — Progress Notes (Signed)
  Subjective:    Patient ID: Teresa Shaffer, female    DOB: 15-Nov-1940, 71 y.o.   MRN: [id number]  HPI Left knee pain - this persists despite 2 sessions of physical therapy. She notes it didn't left knee is swollen but not warm or red. No other joints are painful except for her left neck. She denies pain in her feet.  Left neck pain - past 3 weeks without history of injury. It reminds her of a crick in her neck that she had when she was young. No numbness or weakness in arms.    Fibrocystic breasts- her repeat mammogram was okay  Obesity - no success losing weight Review of Systems  Musculoskeletal: Positive for joint swelling. Negative for arthralgias and gait problem.  Skin: Negative for color change.  Neurological: Negative for weakness and numbness.       Objective:   Physical Exam  Musculoskeletal: She exhibits no edema.       Full range of motion of the neck but pain in the left mid trapezius with palpation. Spurling's test is negative  Left knee does appear to be diffusely enlarged without warmth or redness. No palpable effusion. Tender over the medial joint line and medial patella. Full range of motion with minimal crepitus. No joint locking.   Bilateral hallux valgus and hammertoe deformity of the feet  Neurological: She is alert.       No weakness or hyperreflexia in the arms and hands          Assessment & Plan:

## 2011-12-08 ENCOUNTER — Ambulatory Visit: Payer: Medicare Other | Admitting: Family Medicine

## 2011-12-10 ENCOUNTER — Encounter: Payer: Self-pay | Admitting: Sports Medicine

## 2011-12-10 ENCOUNTER — Ambulatory Visit (INDEPENDENT_AMBULATORY_CARE_PROVIDER_SITE_OTHER): Payer: Medicare Other | Admitting: Sports Medicine

## 2011-12-10 VITALS — BP 129/79 | HR 75 | Ht 61.0 in | Wt 165.0 lb

## 2011-12-10 DIAGNOSIS — M2170 Unequal limb length (acquired), unspecified site: Secondary | ICD-10-CM

## 2011-12-10 DIAGNOSIS — M25569 Pain in unspecified knee: Secondary | ICD-10-CM

## 2011-12-10 DIAGNOSIS — M25562 Pain in left knee: Secondary | ICD-10-CM

## 2011-12-10 HISTORY — DX: Unequal limb length (acquired), unspecified site: M21.70

## 2011-12-10 NOTE — Patient Instructions (Addendum)
Use compression sleeve when standing a lot or walking to keep down swelling/ don't sleep in it  You have degenerative flattening of the meniscus on inside and a 2 cm shorter left leg  Keep up straight leg and lateral leg lifts Ride the recumbent bike  We should add lifts to the left shoe for future shoes  swelling ( effusion) is moderate  Injection will primarily help if pain is pretty strong  Next option would be to see if we should try to support your left ankle better  Try these things first and let's recheck in a couple months

## 2011-12-10 NOTE — Progress Notes (Signed)
Patient ID: Teresa Shaffer, female   DOB: June 21, 1940, 71 y.o.   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  Examination Pleasant moderately obese white female  15 deg of valgus on left knee Marked foot breakdown RT and LT Left ankle subluxation Left sinus tarsi swelling Bilat long arch breakdown bilat hammertoes  Left knee shows both were valgus and more soft tissue puffiness than the right She is able to get full extension Flexion is 130 McMurray testing causes some pain along the medial joint line  Left leg is 2 cm shorter and this is exacerbated by her valgus shift  Musculoskeletal ultrasound She has a mild effusion in the suprapatellar pouch Quadriceps and patellar tendons are intact Lateral meniscus shows good cartilage Medial meniscus and the anterior half looks good but there is some calcification and some degeneration in the posterior half of the meniscus Mild spurring along the joint line  X-rays are reviewed and show very 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

## 2011-12-10 NOTE — Assessment & Plan Note (Signed)
Tried a felt wedge to see if we could correct the leg length She wears a sandal and so she tends to slide off of the lift  Since her hammertoes require her to be in sandals we will give this a try However if she cannot keep her foot in place we may have to at some support to the left ankle to keep her alignment better  Her gait was deftly improved with this

## 2011-12-10 NOTE — Assessment & Plan Note (Signed)
On examination and ultrasound today there were changes along the medial joint line then at the patellofemoral joint  I suspect her symptoms are more related to degenerative meniscus change along the medial joint line particularly since she shows a mild effusion  Patellar compression sleeve was tried and this was comfortable  She was also given some exercises to strengthen her quad and hip muscles  Will recheck in a couple of months and see if she needs additional interventions

## 2012-03-03 ENCOUNTER — Ambulatory Visit (INDEPENDENT_AMBULATORY_CARE_PROVIDER_SITE_OTHER): Payer: Medicare Other | Admitting: Family Medicine

## 2012-03-03 ENCOUNTER — Encounter: Payer: Self-pay | Admitting: Family Medicine

## 2012-03-03 VITALS — BP 129/81 | HR 80 | Ht 61.0 in | Wt 159.0 lb

## 2012-03-03 DIAGNOSIS — B0229 Other postherpetic nervous system involvement: Secondary | ICD-10-CM

## 2012-03-03 DIAGNOSIS — S91032A Puncture wound without foreign body, left ankle, initial encounter: Secondary | ICD-10-CM

## 2012-03-03 DIAGNOSIS — S81009A Unspecified open wound, unspecified knee, initial encounter: Secondary | ICD-10-CM

## 2012-03-03 DIAGNOSIS — M25569 Pain in unspecified knee: Secondary | ICD-10-CM

## 2012-03-03 DIAGNOSIS — E669 Obesity, unspecified: Secondary | ICD-10-CM

## 2012-03-03 DIAGNOSIS — S91009A Unspecified open wound, unspecified ankle, initial encounter: Secondary | ICD-10-CM

## 2012-03-03 DIAGNOSIS — G609 Hereditary and idiopathic neuropathy, unspecified: Secondary | ICD-10-CM

## 2012-03-03 DIAGNOSIS — Z23 Encounter for immunization: Secondary | ICD-10-CM

## 2012-03-03 DIAGNOSIS — M25562 Pain in left knee: Secondary | ICD-10-CM

## 2012-03-03 LAB — POCT GLYCOSYLATED HEMOGLOBIN (HGB A1C): Hemoglobin A1C: 5.9

## 2012-03-03 LAB — VITAMIN B12: Vitamin B-12: 405 pg/mL (ref 211–911)

## 2012-03-03 NOTE — Progress Notes (Signed)
Subjective:     Patient ID: Teresa Shaffer, female   DOB: 01/29/1940, 72 y.o.   MRN: [id number]  HPI  Knee pain - Pain is under control. Patient reports that she is riding a recumbent bike 6 days/week (2-4 miles/day).  Patient is also doing stretches and exercises that she learned from PT.  Patient's primary concern is knee instability.  She feels that her knee is "going to give way."  Occurs more often when she is going up or coming down steps.  Patient also reports difficulty getting back upright after squatting.  Obesity - Patient has recently lost 5 lbs (secondary to increased physical activity - see above).  Peripheral Neuropathy - Patient is currently taking ~ 3200 mg Gabapentin daily.  She is inquiring about alternative treatment/medications.  Pain occasionally keeps her up at night.  Pain is described as discomfort (denies burning, stabbing pain).  Pain is primarily located at the left foot and ankle.  Review of Systems MSK: Left joint swelling.  Neuro: Continues to have neuropathic pain.     Objective:   Physical Exam General: well appearing elderly lady. NAD. MSK: Knee - Slightly tender to palpation at lateral joint line and lateral patella on left.  Good ROM - both flexion and extension. Abrasion noted on left lateral 5th toe.  Neurological: Proprioception intact in lower extremities.    Assessment:        Plan:

## 2012-03-03 NOTE — Patient Instructions (Addendum)
It was nice seeing you today.   We are checking Hb A1C and Vitamin b12 today.  Continue to take all of your medications as prescribed.   Continue exercising.  Congratulations on losing 5 lbs.

## 2012-03-03 NOTE — Assessment & Plan Note (Signed)
Well controlled on Gabapentin

## 2012-03-03 NOTE — Assessment & Plan Note (Signed)
Td given today 

## 2012-03-03 NOTE — Assessment & Plan Note (Signed)
Patient has lost 5 lbs.  Patient congratulated on weight loss.  Patient encourage to continue diet and exercise.

## 2012-03-03 NOTE — Assessment & Plan Note (Signed)
Patient encouraged to continue exercise and continue weight loss.\ Pain is well controlled at this time.

## 2012-03-04 ENCOUNTER — Encounter: Payer: Self-pay | Admitting: Family Medicine

## 2012-07-13 ENCOUNTER — Telehealth: Payer: Self-pay | Admitting: *Deleted

## 2012-07-13 NOTE — Telephone Encounter (Signed)
Theodosia Paling, NP with [facility] left message on pharmacy/physician line.  States she went out to see Teresa Shaffer today and her exam looked pretty good except for a little bit of irregularity in her heart rate.  She does not feel it is an atrial fib thing.  After listening for a full minute, there is an occasional extra beat or skipping of a beat.  Teresa Shaffer informed her that she has never been told anything like that before.  Blood pressure looked good today.  Just wanted to make Dr. Sheffield Slider aware.  No return phone number for Teresa Shaffer was left.  Will forward to Dr. Sheffield Slider for review.  Ileana Ladd

## 2012-07-21 NOTE — Telephone Encounter (Signed)
Noted  

## 2012-09-02 ENCOUNTER — Encounter: Payer: Self-pay | Admitting: Family Medicine

## 2012-09-02 ENCOUNTER — Ambulatory Visit (INDEPENDENT_AMBULATORY_CARE_PROVIDER_SITE_OTHER): Payer: Medicare Other | Admitting: Family Medicine

## 2012-09-02 VITALS — BP 136/80 | HR 80 | Ht 61.0 in | Wt 162.0 lb

## 2012-09-02 DIAGNOSIS — R2681 Unsteadiness on feet: Secondary | ICD-10-CM

## 2012-09-02 DIAGNOSIS — M25569 Pain in unspecified knee: Secondary | ICD-10-CM

## 2012-09-02 DIAGNOSIS — R269 Unspecified abnormalities of gait and mobility: Secondary | ICD-10-CM

## 2012-09-02 DIAGNOSIS — M25562 Pain in left knee: Secondary | ICD-10-CM

## 2012-09-02 DIAGNOSIS — H6120 Impacted cerumen, unspecified ear: Secondary | ICD-10-CM

## 2012-09-02 DIAGNOSIS — H6121 Impacted cerumen, right ear: Secondary | ICD-10-CM

## 2012-09-02 HISTORY — DX: Impacted cerumen, unspecified ear: H61.20

## 2012-09-02 HISTORY — DX: Unsteadiness on feet: R26.81

## 2012-09-02 MED ORDER — GABAPENTIN 600 MG PO TABS: 600.0000 mg | ORAL_TABLET | Freq: Four times a day (QID) | ORAL | Status: DC

## 2012-09-02 NOTE — Assessment & Plan Note (Addendum)
The pain is well-controlled with as needed Tylenol and Naprosyn. She is having some instability of the knee that causes her to be unbalanced at times. -Patient continue home exercise regimen  -Advised patient to wear the brace as needed to help with stability -She can use Tylenol and Naprosyn as needed for pain, patient to wear soft soled implant to help with leg length discrepancy

## 2012-09-02 NOTE — Progress Notes (Addendum)
  Subjective:    Patient ID: Teresa Shaffer, female    DOB: 01/05/1941, 72 y.o.   MRN: [id number]  HPI 72 year old female presents for evaluation of multiple medical problems.  Cerumen impaction right ear-patient was recently evaluated by [facility] nurse and found to have impacted cerumen of the right ear, this has contributed to some decreased hearing in that ear, patient would like for this to be removed, she has been using home Debrox drops  Unsteady gait-patient states that she is occasionally unsteady while ambulating,she denies dizziness or vertigo, no vision changes, no loss of consciousness, no syncope or presyncope, she feels that she is just unsteady with ambulation, does not use the use of a cane or walker, she doesn't may be related to her left knee which feels like it may give out occasionally, she has a documented history of leg length discrepancy as well as mild OA of the left knee, she's previously been evaluated by Dr. for fields, she was prescribed home exercise regimen which she has been performing routinely, she does not use a knee brace  Left knee pain-this is well-controlled with as needed Tylenol and Motrin, she does home exercise routine, she has some swelling in the left knee that is worse on the right, has occasional locking and clicking, feels that her left knee may give out occasionally, no warmth or swelling of the joint was noted  Patient had recent home evaluation by [facility] [employer] [facility] nurse and was found to have "abnormal heart rhythm ", patient states that she had her daughter who is also nurse recheck her heart rate and was normal per exam,patient denies syncope, no lightheadedness, no chest pain, no shortness of breath, no palpitations  Review of Systems  Constitutional: Negative for fever, chills and fatigue.  Cardiovascular: Negative for chest pain and palpitations.  Gastrointestinal: Negative for nausea, vomiting and diarrhea.  Neurological: Negative  for dizziness, syncope, weakness, light-headedness and headaches.       Objective:   Physical Exam Vitals: Reviewed General: Pleasant, Caucasian female in no acute distress HEENT: Normocephalic, bilateral external ear canals were unremarkable except for cerumen impaction of the right ear, unable to visualize right TM, left TM was within normal limits, pupils are equal and round and reactive to light, extraocular movements are intact, no scleral icterus, moist mucous membranes, uvula midline, neck was supple, no posterior anterior lymphadenopathy were noted Cardiac: Regular rate and rhythm, S1 and S2 present, no murmurs, no heaves or thrills Respiratory: Clear to auscultation bilaterally, normal effort Neurologic: Cranial nerves II through XII are intact, strength is 5 out of 5 in all extremities, gross sensation was intact in all extremities, pronator drift and Romberg testing were within normal limits, patient is able to perform bilateral finger to nose testing, no evidence of dysdiadochokinesia, patient able to stand from chair without help of upper extremity, gait was normal MSK: Left lower extremity and valgus positioning with ambulation, left lower extremity-suprapatella swelling of the left knee present, ligament testing including varus, valgus, Lachman's testing were within normal limits, some clicking was noted with McMurray's testing, range of motion within normal limits; right lower extremity-no edema noted of the right knee, ligament testing within normal limits, range of motion normal       Assessment & Plan:  Please see problem specific assessment and plan.

## 2012-09-02 NOTE — Patient Instructions (Addendum)
Impacted earwax of right ear-continued use the proximal drops as needed to clear earwax  Unsteadiness on feet-this is likely secondary to her left knee osteoarthritis, I recommend that you continue your home exercise routine and to wear her brace with ambulation to help decrease swelling  Return in one month for yearly well visit and preventative exam

## 2012-09-02 NOTE — Assessment & Plan Note (Signed)
Unsteady gait likely secondary to leg length discrepancy and left knee pain, neurologic examination was within normal limits -Patient to continue home exercise regimen and treatment for left knee pain

## 2012-09-02 NOTE — Assessment & Plan Note (Addendum)
Cerumen impaction of right ear -WaterPik use in office by nurse to relieve impaction

## 2012-09-20 ENCOUNTER — Encounter: Payer: Self-pay | Admitting: Home Health Services

## 2012-12-26 ENCOUNTER — Ambulatory Visit (INDEPENDENT_AMBULATORY_CARE_PROVIDER_SITE_OTHER): Payer: Medicare Other | Admitting: Family Medicine

## 2012-12-26 ENCOUNTER — Encounter: Payer: Self-pay | Admitting: Family Medicine

## 2012-12-26 VITALS — BP 117/71 | HR 73 | Temp 97.9°F | Resp 18 | Wt 166.0 lb

## 2012-12-26 DIAGNOSIS — J019 Acute sinusitis, unspecified: Secondary | ICD-10-CM | POA: Insufficient documentation

## 2012-12-26 HISTORY — DX: Acute sinusitis, unspecified: J01.90

## 2012-12-26 MED ORDER — AMOXICILLIN 875 MG PO TABS: 875.0000 mg | ORAL_TABLET | Freq: Two times a day (BID) | ORAL | Status: DC

## 2012-12-26 NOTE — Assessment & Plan Note (Signed)
Acute sinusitis that is worsening after 7 days while on nasal steroid, aleve. Recommended increase aleve and ad sudafed and mucinex.  Amoxicillin.

## 2012-12-26 NOTE — Patient Instructions (Addendum)
You have a sinus infection that will likely need antibiotics to clear this Please take them for the full 10 days Please also consider adding sudafed and increasing your aleve to 2 pills twice a day.  Sinusitis Sinusitis is redness, soreness, and swelling (inflammation) of the paranasal sinuses. Paranasal sinuses are air pockets within the bones of your face (beneath the eyes, the middle of the forehead, or above the eyes). In healthy paranasal sinuses, mucus is able to drain out, and air is able to circulate through them by way of your nose. However, when your paranasal sinuses are inflamed, mucus and air can become trapped. This can allow bacteria and other germs to grow and cause infection. Sinusitis can develop quickly and last only a short time (acute) or continue over a long period (chronic). Sinusitis that lasts for more than 12 weeks is considered chronic.  CAUSES  Causes of sinusitis include:  Allergies.  Structural abnormalities, such as displacement of the cartilage that separates your nostrils (deviated septum), which can decrease the air flow through your nose and sinuses and affect sinus drainage.  Functional abnormalities, such as when the small hairs (cilia) that line your sinuses and help remove mucus do not work properly or are not present. SYMPTOMS  Symptoms of acute and chronic sinusitis are the same. The primary symptoms are pain and pressure around the affected sinuses. Other symptoms include:  Upper toothache.  Earache.  Headache.  Bad breath.  Decreased sense of smell and taste.  A cough, which worsens when you are lying flat.  Fatigue.  Fever.  Thick drainage from your nose, which often is green and may contain pus (purulent).  Swelling and warmth over the affected sinuses. DIAGNOSIS  Your caregiver will perform a physical exam. During the exam, your caregiver may:  Look in your nose for signs of abnormal growths in your nostrils (nasal polyps).  Tap  over the affected sinus to check for signs of infection.  View the inside of your sinuses (endoscopy) with a special imaging device with a light attached (endoscope), which is inserted into your sinuses. If your caregiver suspects that you have chronic sinusitis, one or more of the following tests may be recommended:  Allergy tests.  Nasal culture A sample of mucus is taken from your nose and sent to a lab and screened for bacteria.  Nasal cytology A sample of mucus is taken from your nose and examined by your caregiver to determine if your sinusitis is related to an allergy. TREATMENT  Most cases of acute sinusitis are related to a viral infection and will resolve on their own within 10 days. Sometimes medicines are prescribed to help relieve symptoms (pain medicine, decongestants, nasal steroid sprays, or saline sprays).  However, for sinusitis related to a bacterial infection, your caregiver will prescribe antibiotic medicines. These are medicines that will help kill the bacteria causing the infection.  Rarely, sinusitis is caused by a fungal infection. In theses cases, your caregiver will prescribe antifungal medicine. For some cases of chronic sinusitis, surgery is needed. Generally, these are cases in which sinusitis recurs more than 3 times per year, despite other treatments. HOME CARE INSTRUCTIONS   Drink plenty of water. Water helps thin the mucus so your sinuses can drain more easily.  Use a humidifier.  Inhale steam 3 to 4 times a day (for example, sit in the bathroom with the shower running).  Apply a warm, moist washcloth to your face 3 to 4 times a day, or  as directed by your caregiver.  Use saline nasal sprays to help moisten and clean your sinuses.  Take over-the-counter or prescription medicines for pain, discomfort, or fever only as directed by your caregiver. SEEK IMMEDIATE MEDICAL CARE IF:  You have increasing pain or severe headaches.  You have nausea, vomiting,  or drowsiness.  You have swelling around your face.  You have vision problems.  You have a stiff neck.  You have difficulty breathing. MAKE SURE YOU:   Understand these instructions.  Will watch your condition.  Will get help right away if you are not doing well or get worse. Document Released: 12/22/2004 Document Revised: 03/16/2011 Document Reviewed: 01/06/2011 [facility] Patient Information 2014 [place], [place].

## 2012-12-26 NOTE — Progress Notes (Signed)
Teresa Shaffer is a 72 y.o. female who presents to [facility] today for SD appt for sinus pain  Sinus pain: symptoms started 7 days ago. Tried flunisolide w/o benefit which typically works for pt. Associated HA, and persistent cough and sinus congestion. Deneis nausea, fever, vomiting, and rash. Worsening symptoms.  Tried benadryl w/ mild improvement. No sick contacts.   The following portions of the patient's history were reviewed and updated as appropriate: allergies, current medications, past medical history, family and social history, and problem list.  Patient is a nonsmoker.  Health Maintenance: needs flu shot  No past medical history on file.  ROS as above otherwise neg.    Medications reviewed. Current Outpatient Prescriptions  Medication Sig Dispense Refill  . acetaminophen (TYLENOL) 650 MG CR tablet Take 650 mg by mouth every 8 (eight) hours as needed.      Marland Kitchen amoxicillin (AMOXIL) 875 MG tablet Take 1 tablet (875 mg total) by mouth 2 (two) times daily.  20 tablet  0  . aspirin 81 MG tablet Take 81 mg by mouth daily.        . beta carotene w/minerals (OCUVITE) tablet Take 1 tablet by mouth daily.      . Calcium Carbonate-Vitamin D (CALTRATE 600+D) 600-400 MG-UNIT per tablet Take 1 tablet by mouth 2 (two) times daily.        . Fish Oil OIL Take 1 capsule by mouth 2 (two) times daily.       . flunisolide (NASALIDE) 0.025 % SOLN 2 sprays by Nasal route daily.        Marland Kitchen gabapentin (NEURONTIN) 600 MG tablet Take 1 tablet (600 mg total) by mouth 4 (four) times daily.  120 tablet  5  . Multiple Vitamin (MULTIVITAMINS PO) Take one tablet daily.        No current facility-administered medications for this visit.    Exam:  BP 117/71  Pulse 73  Temp(Src) 97.9 F (36.6 C) (Oral)  Resp 18  Wt 166 lb (75.297 kg)  SpO2 96% Gen: Well NAD HEENT: EOMI,  MMM, maxillary sinuses painful to palpation L>R, boggy nasal turbinates, adn pharyngeal cobblestoning Lungs: CTABL Nl WOB Heart: RRR no  MRG Abd: NABS, NT, ND Exts: Non edematous BL  LE, warm and well perfused.   No results found for this or any previous visit (from the past 72 hour(s)).  A/P (as seen in Problem list)  Sinusitis, acute Acute sinusitis that is worsening after 7 days while on nasal steroid, aleve. Recommended increase aleve and ad sudafed and mucinex.  Amoxicillin.

## 2013-01-10 ENCOUNTER — Telehealth: Payer: Self-pay | Admitting: *Deleted

## 2013-01-10 ENCOUNTER — Ambulatory Visit (INDEPENDENT_AMBULATORY_CARE_PROVIDER_SITE_OTHER): Payer: Medicare Other | Admitting: *Deleted

## 2013-01-10 DIAGNOSIS — Z23 Encounter for immunization: Secondary | ICD-10-CM

## 2013-01-10 MED ORDER — BENZONATATE 100 MG PO CAPS: 100.0000 mg | ORAL_CAPSULE | Freq: Two times a day (BID) | ORAL | Status: DC | PRN

## 2013-01-10 MED ORDER — IPRATROPIUM BROMIDE 0.06 % NA SOLN: 2.0000 | Freq: Four times a day (QID) | NASAL | Status: DC

## 2013-01-10 NOTE — Telephone Encounter (Signed)
  Sinus pressure and headache and fever are resolved Still w/ rinorrhea and cough.  Sudafed w/ some benefit.  Still using flonase.   Sinusitis resolved and now w/ postnasal drip related cough and congestion  Recommending Delsym Will Rx intranasal atrovent and tessalon perles.  Pt amenable  Linna Darner, MD Family Medicine PGY-3 01/10/2013, 1:40 PM

## 2013-01-10 NOTE — Telephone Encounter (Signed)
Patient seen by Marily Memos 2 weeks ago for sinusitis and was prescribed antibiotics. Has finished course of amoxicillin but still has nasal congestion and bad cough, no fevers. Wants to know if something can be sent in for the cough or if she needs another round of abx. Will forward to [place].

## 2013-02-16 ENCOUNTER — Ambulatory Visit (INDEPENDENT_AMBULATORY_CARE_PROVIDER_SITE_OTHER): Payer: Medicare Other | Admitting: Family Medicine

## 2013-02-16 ENCOUNTER — Encounter: Payer: Self-pay | Admitting: Family Medicine

## 2013-02-16 ENCOUNTER — Other Ambulatory Visit: Payer: Medicare Other

## 2013-02-16 ENCOUNTER — Ambulatory Visit: Payer: Medicare Other | Admitting: Home Health Services

## 2013-02-16 VITALS — BP 151/89 | HR 84 | Temp 97.8°F | Ht 61.0 in | Wt 166.0 lb

## 2013-02-16 DIAGNOSIS — M25569 Pain in unspecified knee: Secondary | ICD-10-CM

## 2013-02-16 DIAGNOSIS — M7581 Other shoulder lesions, right shoulder: Secondary | ICD-10-CM

## 2013-02-16 DIAGNOSIS — M67919 Unspecified disorder of synovium and tendon, unspecified shoulder: Secondary | ICD-10-CM

## 2013-02-16 DIAGNOSIS — E781 Pure hyperglyceridemia: Secondary | ICD-10-CM

## 2013-02-16 DIAGNOSIS — M75120 Complete rotator cuff tear or rupture of unspecified shoulder, not specified as traumatic: Secondary | ICD-10-CM | POA: Insufficient documentation

## 2013-02-16 DIAGNOSIS — M719 Bursopathy, unspecified: Secondary | ICD-10-CM

## 2013-02-16 DIAGNOSIS — M25562 Pain in left knee: Secondary | ICD-10-CM

## 2013-02-16 HISTORY — DX: Complete rotator cuff tear or rupture of unspecified shoulder, not specified as traumatic: M75.120

## 2013-02-16 NOTE — Patient Instructions (Signed)
It was good to see today?  I think you have rotator cuff tendinitis, please take it easy on your shoulder exercises for about one week and try ice every 2 hours as needed (approximately 3 or 4 times a day).  If you don't improve I be glad to send her to Dr. Oneida Alar again.  Rotator Cuff Tendinitis  Rotator cuff tendinitis is inflammation of the tough, cord-like bands that connect muscle to bone (tendons) in your rotator cuff. Your rotator cuff is the collection of all the muscles and tendons that connect your arm to your shoulder. Your rotator cuff holds the head of your upper arm bone (humerus) in the cup (fossa) of your shoulder blade (scapula). CAUSES Rotator cuff tendinitis is usually caused by overusing the joint involved.  SIGNS AND SYMPTOMS  Deep ache in the shoulder also felt on the outside upper arm over the shoulder muscle.  Point tenderness over the area that is injured.  Pain comes on gradually and becomes worse with lifting the arm to the side (abduction) or turning it inward (internal rotation).  May lead to a chronic tear: When a rotator cuff tendon becomes inflamed, it runs the risk of losing its blood supply, causing some tendon fibers to die. This increases the risk that the tendon can fray and partially or completely tear. DIAGNOSIS Rotator cuff tendinitis is diagnosed by taking a medical history, performing a physical exam, and reviewing results of imaging exams. The medical history is useful to help determine the type of rotator cuff injury. The physical exam will include looking at the injured shoulder, feeling the injured area, and watching you do range-of-motion exercises. X-ray exams are typically done to rule out other causes of shoulder pain, such as fractures. MRI is the imaging exam usually used for significant shoulder injuries. Sometimes a dye study called CT arthrogram is done, but it is not as widely used as MRI. In some institutions, special ultrasound tests may  also be used to aid in the diagnosis. TREATMENT  Less Severe Cases  Use of a sling to rest the shoulder for a short period of time. Prolonged use of the sling can cause stiffness, weakness, and loss of motion of the shoulder joint.  Anti-inflammatory medicines, such as ibuprofen or naproxen sodium, may be prescribed. More Severe Cases  Physical therapy.  Use of steroid injections into the shoulder joint.  Surgery. HOME CARE INSTRUCTIONS   Use a sling or splint until the pain decreases. Prolonged use of the sling can cause stiffness, weakness, and loss of motion of the shoulder joint.  Apply ice to the injured area:  Put ice in a plastic bag.  Place a towel between your skin and the bag.  Leave the ice on for 20 minutes, 2 3 times a day.  Try to avoid use other than gentle range of motion while your shoulder is painful. Use the shoulder and exercise only as directed by your health care provider. Stop exercises or range of motion if pain or discomfort increases, unless directed otherwise by your health care provider.  Only take over-the-counter or prescription medicines for pain, discomfort, or fever as directed by your health care provider.  If you were given a shoulder sling and straps (immobilizer), do not remove it except as directed, or until you see a health care provider for a follow-up exam. If you need to remove it, move your arm as little as possible or as directed.  You may want to sleep on several pillows  at night to lessen swelling and pain. SEEK IMMEDIATE MEDICAL CARE IF:   Your shoulder pain increases or new pain develops in your arm, hand, or fingers and is not relieved with medicines.  You have new, unexplained symptoms, especially increased numbness in the hands or loss of strength.  You develop any worsening of the problems that brought you in for care.  Your arm, hand, or fingers are numb or tingling.  Your arm, hand, or fingers are swollen, painful, or  turn white or blue. MAKE SURE YOU:  Understand these instructions.  Will watch your condition.  Will get help right away if you are not doing well or get worse. Document Released: 03/14/2003 Document Revised: 10/12/2012 Document Reviewed: 08/03/2012 [facility] Patient Information 2014 [place], [place].

## 2013-02-16 NOTE — Assessment & Plan Note (Signed)
Mild symptoms on Hawkins test, full range of motion and strength Encouraged ice and avoiding the and shoulder exercises that seem to have preceded this for about one week. I discussed that if this does not improve I will gladly send her to see Dr. Oneida Alar again.

## 2013-02-16 NOTE — Assessment & Plan Note (Signed)
Triglycerides previously around 250 Patient now watching her diet a little more closely with her husband. Ordered fasting lipid panel, CMP, and CBC for her to do in the future at her convenience.

## 2013-02-16 NOTE — Assessment & Plan Note (Signed)
Worsened slightly lightly, likely due to increase use and reduction in her exercises Encouraged  use of her patellar compression sleeve and exercises  Considering her short duration and ask her to return to her conservative measures and return to the clinic if she is improved.  Signed form for 3 months handicap placard since she is having to go to the [place] so much with her husband.

## 2013-02-16 NOTE — Progress Notes (Signed)
Patient ID: Teresa Shaffer, female   DOB: Jul 03, 1940, 73 y.o.   MRN: [id number]  Kenn File, MD Phone: [phone]  Subjective:  Chief complaint-noted  # Patient here for acute visit for left knee pain and right shoulder pain  Left knee pain Has had left knee pain for over a year now, she previously saw Dr. Oneida Alar who prescribed a brace and exercises. She's worn the brace, began consistent exercises, and physical therapy and had good improvement. Her knee pain has resolved completely and had a waxing waning course since then. It's been worse slightly for the last 2-3 days. 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 She also has not been able to do her exercises or wear her knee brace.  Right shoulder pain Right shoulder pain has been bothering her recently described as an annoying pain when she lays on her right shoulder at night. She also describes some crepitus when she moves her arm. She has no reduction in range of motion, no weakness, and no problems accomplishing her daily activities. She denies any trauma or injury to the shoulder but states that she was recently doing pull down exercises at the gym and she feels that she was probably trying to pick up more than she should have. She can comfortably lift 20 pounds on that exercise but was trying 50.  She has hypercholesterolemia and her problem list, she and her husband have been watching her diet lately as he had very high triglycerides and recently drawn.  ROS- No fever, chills, sweats  Past Medical History Patient Active Problem List   Diagnosis Date Noted  . Right rotator cuff tendinitis 02/16/2013  . Sinusitis, acute 12/26/2012  . Cerumen impaction 09/02/2012  . Unsteady gait 09/02/2012  . 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 Pain of cervical spine 11/10/2011  . Hypertriglyceridemia 08/20/2011  . 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 Knee pain, left 08/04/2011  . 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 OSTEOPENIA 04/26/2007  . OBESITY, NOS 03/04/2006  . NEUROPATHY, PERIPHERAL 03/04/2006  . FIBROADENOSIS, BREAST 03/04/2006    Medications- reviewed and updated Current Outpatient Prescriptions  Medication Sig Dispense Refill  . acetaminophen (TYLENOL) 650 MG CR tablet Take 650 mg by mouth every 8 (eight) hours as needed.      Marland Kitchen amoxicillin (AMOXIL) 875 MG tablet Take 1 tablet (875 mg total) by mouth 2 (two) times daily.  20 tablet  0  . aspirin 81 MG tablet Take 81 mg by mouth daily.        . benzonatate (TESSALON) 100 MG capsule Take 1 capsule (100 mg total) by mouth 2 (two) times daily as needed for cough.  20 capsule  0  . beta carotene w/minerals (OCUVITE) tablet Take 1 tablet by mouth daily.      . Calcium Carbonate-Vitamin D (CALTRATE 600+D) 600-400 MG-UNIT per tablet Take 1 tablet by mouth 2 (two) times daily.        . Fish Oil OIL Take 1 capsule by mouth 2 (two) times daily.       . flunisolide (NASALIDE) 0.025 % SOLN 2 sprays by Nasal route daily.        Marland Kitchen gabapentin (NEURONTIN) 600 MG tablet Take 1 tablet (600 mg total) by mouth 4 (four) times daily.  120 tablet  5  . ipratropium (ATROVENT) 0.06 % nasal spray Place 2 sprays into both nostrils  4 (four) times daily.  15 mL  12  . Multiple Vitamin (MULTIVITAMINS PO) Take one tablet daily.        No current facility-administered medications for this visit.    Objective: BP 151/89  Pulse 84  Temp(Src) 97.8 F (36.6 C) (Oral)  Ht 5\' 1"  (1.549 m)  Wt 166 lb (75.297 kg)  BMI 31.38 kg/m2 Gen: NAD, alert, cooperative with exam HEENT: NCAT MSK: Full ROM of R shoulde, some pain with hawkins test, negative empty can, no joint line tenderness.   L knee slightly larger in appearance compare dto the R, NO joint laxity or tenderness to palption, full strength and full ROM Neuro: Alert and oriented, Strength 5/5 and sensation intact in all 4  extremities   Assessment/Plan:  Knee pain, left Worsened slightly lightly, likely due to increase use and reduction in her exercises Encouraged  use of her patellar compression sleeve and exercises  Considering her short duration and ask her to return to her conservative measures and return to the clinic if she is improved.  Signed form for 3 months handicap placard since she is having to go to the [place] so much with her husband.   Right rotator cuff tendinitis  Mild symptoms on Hawkins test, full range of motion and strength Encouraged ice and avoiding the and shoulder exercises that seem to have preceded this for about one week. I discussed that if this does not improve I will gladly send her to see Dr. Oneida Alar again.   Hypertriglyceridemia Triglycerides previously around 250 Patient now watching her diet a little more closely with her husband. Ordered fasting lipid panel, CMP, and CBC for her to do in the future at her convenience.    Orders Placed This Encounter  Procedures  . Comprehensive metabolic panel    Standing Status: Future     Number of Occurrences:      Standing Expiration Date: 02/16/2014  . CBC with Differential    Standing Status: Future     Number of Occurrences:      Standing Expiration Date: 02/16/2014  . Lipid Panel    Standing Status: Future     Number of Occurrences:      Standing Expiration Date: 02/16/2014

## 2013-02-17 ENCOUNTER — Other Ambulatory Visit: Payer: Medicare Other

## 2013-02-17 DIAGNOSIS — M25562 Pain in left knee: Secondary | ICD-10-CM

## 2013-02-17 DIAGNOSIS — E781 Pure hyperglyceridemia: Secondary | ICD-10-CM

## 2013-02-17 LAB — COMPREHENSIVE METABOLIC PANEL
ALT: 22 U/L (ref 0–35)
AST: 21 U/L (ref 0–37)
Albumin: 4 g/dL (ref 3.5–5.2)
Alkaline Phosphatase: 94 U/L (ref 39–117)
BILIRUBIN TOTAL: 0.6 mg/dL (ref 0.2–1.2)
BUN: 23 mg/dL (ref 6–23)
CALCIUM: 9.5 mg/dL (ref 8.4–10.5)
CHLORIDE: 103 meq/L (ref 96–112)
CO2: 30 meq/L (ref 19–32)
Creat: 0.64 mg/dL (ref 0.50–1.10)
GLUCOSE: 99 mg/dL (ref 70–99)
Potassium: 4.4 mEq/L (ref 3.5–5.3)
Sodium: 142 mEq/L (ref 135–145)
Total Protein: 6.5 g/dL (ref 6.0–8.3)

## 2013-02-17 LAB — CBC WITH DIFFERENTIAL/PLATELET
Basophils Absolute: 0 10*3/uL (ref 0.0–0.1)
Basophils Relative: 0 % (ref 0–1)
Eosinophils Absolute: 0.2 10*3/uL (ref 0.0–0.7)
Eosinophils Relative: 2 % (ref 0–5)
HEMATOCRIT: 38.9 % (ref 36.0–46.0)
HEMOGLOBIN: 13.2 g/dL (ref 12.0–15.0)
LYMPHS ABS: 2.6 10*3/uL (ref 0.7–4.0)
LYMPHS PCT: 35 % (ref 12–46)
MCH: 29.6 pg (ref 26.0–34.0)
MCHC: 33.9 g/dL (ref 30.0–36.0)
MCV: 87.2 fL (ref 78.0–100.0)
MONO ABS: 0.5 10*3/uL (ref 0.1–1.0)
Monocytes Relative: 7 % (ref 3–12)
NEUTROS ABS: 4.1 10*3/uL (ref 1.7–7.7)
Neutrophils Relative %: 56 % (ref 43–77)
Platelets: 260 10*3/uL (ref 150–400)
RBC: 4.46 MIL/uL (ref 3.87–5.11)
RDW: 14.1 % (ref 11.5–15.5)
WBC: 7.3 10*3/uL (ref 4.0–10.5)

## 2013-02-17 LAB — LIPID PANEL
CHOL/HDL RATIO: 4.4 ratio
Cholesterol: 190 mg/dL (ref 0–200)
HDL: 43 mg/dL (ref >39)
LDL CALC: 113 mg/dL — AB (ref 0–99)
TRIGLYCERIDES: 169 mg/dL — AB (ref <150)
VLDL: 34 mg/dL (ref 0–40)

## 2013-02-17 NOTE — Progress Notes (Signed)
CMP,CBC WITH DIFF, FLP DONE TODAY Teresa Shaffer 

## 2013-02-21 ENCOUNTER — Encounter: Payer: Self-pay | Admitting: Family Medicine

## 2013-02-23 ENCOUNTER — Ambulatory Visit (INDEPENDENT_AMBULATORY_CARE_PROVIDER_SITE_OTHER): Payer: Medicare Other | Admitting: Home Health Services

## 2013-02-23 VITALS — BP 103/68 | HR 71 | Temp 97.9°F | Ht 61.0 in | Wt 166.0 lb

## 2013-02-23 DIAGNOSIS — Z0000 Encounter for general adult medical examination without abnormal findings: Secondary | ICD-10-CM

## 2013-02-24 ENCOUNTER — Encounter: Payer: Self-pay | Admitting: Home Health Services

## 2013-02-24 ENCOUNTER — Other Ambulatory Visit: Payer: Self-pay

## 2013-02-24 DIAGNOSIS — Z1231 Encounter for screening mammogram for malignant neoplasm of breast: Secondary | ICD-10-CM

## 2013-02-24 NOTE — Progress Notes (Signed)
Patient here for annual wellness visit, patient reports: Risk Factors/Conditions needing evaluation or treatment: Pt does not have any new risk factors that need evaluation. Home Safety: Pt lives with husband in 3 story home.  Pt reports having smoke detectors and adaptive equipment in bathroom. Other Information: Corrective lens: Pt wears daily corrective lens.  Has annual eye exams. Dentures: Pt does not have dentures.  Has annual dental exams. Memory: Pt denies any memory problems. Patient's Mini Mental Score (recorded in doc. flowsheet): 30 Pt denies any hearing or vision problems. Balance/Gait: Pt reports no falls in the past year.  Reports no weakness in leg or dizziness.  We discussed home strategies for reducing risk of falling. Physical Activity: Pt reports exercising 3-4 times at week at [facility].  Does recumbent bike. We discussed maintaining her curtain activity level.  Pt reports no problems with ADLs/IADLs.  Documented in nurse assesment. We discussed completing Advance Directives.  We discussed patient scheduling appointment for mammogram.     Annual Wellness Visit Requirements Recorded Today In  Medical, family, social history Past Medical, Family, Social History Section  Current providers Care team  Current medications Medications  Wt, BP, Ht, BMI Vital signs  Tobacco, alcohol, illicit drug use History  ADL Nurse Assessment  Depression Screening Nurse Assessment  Cognitive impairment Nurse Assessment  Mini Mental Status Document Flowsheet  Fall Risk Fall/Depression  Home Safety Progress Note  End of Life Planning (welcome visit) Social Documentation  Medicare preventative services Progress Note  Risk factors/conditions needing evaluation/treatment Progress Note  Personalized health advice Patient Instructions, goals, letter  Diet & Exercise Social Documentation  Emergency Contact Social Documentation  Seat Belts Social Documentation  Sun exposure/protection Social  Documentation   Addendum: I have reviewed this visit and discussed with Lamont Dowdy and agree with her documentation.  [name], MD [place] Resident, PGY-2 02/27/2013, 8:48 AM

## 2013-02-27 ENCOUNTER — Encounter: Payer: Self-pay | Admitting: Home Health Services

## 2013-02-27 ENCOUNTER — Other Ambulatory Visit: Payer: Self-pay | Admitting: Family Medicine

## 2013-02-27 MED ORDER — ZOSTER VACCINE LIVE 19400 UNT/0.65ML ~~LOC~~ SOLR: 0.6500 mL | Freq: Once | SUBCUTANEOUS | Status: DC

## 2013-02-27 NOTE — Telephone Encounter (Signed)
Sent Rx for zostavax per patient's request at her recent annual health maintenance visit.   [name], MD [place] Resident, PGY-2 02/27/2013, 8:45 AM

## 2013-03-07 ENCOUNTER — Ambulatory Visit: Payer: Medicare Other

## 2013-03-09 ENCOUNTER — Ambulatory Visit
Admission: RE | Admit: 2013-03-09 | Discharge: 2013-03-09 | Disposition: A | Discharge status: Home / Self Care | Payer: Medicare Other | Source: Ambulatory Visit

## 2013-03-09 DIAGNOSIS — Z1231 Encounter for screening mammogram for malignant neoplasm of breast: Secondary | ICD-10-CM

## 2013-04-24 ENCOUNTER — Ambulatory Visit (HOSPITAL_COMMUNITY)
Admission: RE | Admit: 2013-04-24 | Discharge: 2013-04-24 | Disposition: A | Discharge status: Home / Self Care | Payer: 59 | Source: Ambulatory Visit | Attending: Family Medicine | Admitting: Family Medicine

## 2013-04-24 ENCOUNTER — Ambulatory Visit (INDEPENDENT_AMBULATORY_CARE_PROVIDER_SITE_OTHER): Payer: 59 | Admitting: Family Medicine

## 2013-04-24 ENCOUNTER — Encounter: Payer: Self-pay | Admitting: Family Medicine

## 2013-04-24 ENCOUNTER — Ambulatory Visit
Admission: RE | Admit: 2013-04-24 | Discharge: 2013-04-24 | Disposition: A | Discharge status: Home / Self Care | Payer: Medicare Other | Source: Ambulatory Visit | Attending: Family Medicine | Admitting: Family Medicine

## 2013-04-24 VITALS — BP 128/74 | HR 73 | Temp 98.3°F | Wt 164.0 lb

## 2013-04-24 DIAGNOSIS — R079 Chest pain, unspecified: Secondary | ICD-10-CM | POA: Insufficient documentation

## 2013-04-24 HISTORY — DX: Chest pain, unspecified: R07.9

## 2013-04-24 NOTE — Progress Notes (Signed)
   Subjective:    Patient ID: Teresa Shaffer, female    DOB: 1940-11-12, 73 y.o.   MRN: [id number]  HPI  73 year old F who presents for evaluation of right shoulder and right side pain.   Right sided chest pain - intermittent, 1.5 weeks duration, sharp sudden pain that comes on suddenly and causes her to lose her breath, resolved within 5 minutes; improved by alka seltzer and increased bra size; no nausea, vomiting, fever, chills, diarrhea, constipation; no similar symptoms,   PMH - no hx of blood clots or breast cancer, no hx of MI  Social - Oceanographer   Review of Systems Positive for right shoulder pain (several months duration) Otherwise see HPI    Objective:   Physical Exam BP 128/74  Pulse 73  Temp(Src) 98.3 F (36.8 C) (Oral)  Wt 164 lb (74.39 kg) Gen: elderly white female, nondistressed, well-appearing CV: RRR, no murmurs Pulm: CTA-B Chest Wall - no rashes, no deformity, mild tenderness along costal margin Abd: no distention or tenderness, no hepatomegaly, negative murphy's sign   Date: 04/24/2013  Rate: 64  Rhythm: normal sinus rhythm  QRS Axis: normal  Intervals: normal  ST/T Wave abnormalities: normal  Conduction Disutrbances:none  Narrative Interpretation: normal  Old EKG Reviewed: none available        Assessment & Plan:

## 2013-04-24 NOTE — Assessment & Plan Note (Signed)
A: right sided, chest wall with pleuritic component, no concern for ACS; low risk of infection or PE; most likely due to irritation from bra strap P: check CXR to r/o pleuritic effusion

## 2013-04-24 NOTE — Patient Instructions (Signed)
Ms. Pesce,   The EKG is perfect. I still would like to get a chest X-ray to make sure there is no fluid on the lung causing this problem. Otherwise, I think you have done well by taking alka seltzer and changin bra size.   Please follow up with Dr. Wendi Snipes or myself regarding your shoulder pain.   Sincerely,   Dr. Maricela Bo

## 2013-04-25 ENCOUNTER — Telehealth: Payer: Self-pay | Admitting: Family Medicine

## 2013-04-25 NOTE — Telephone Encounter (Signed)
Please let patient know that the X-ray did not show anything concerning with her lungs. No further studies are needed at this time.

## 2013-04-25 NOTE — Telephone Encounter (Signed)
LMVM for patient to call back.  Please see Md note below.  [place]

## 2013-04-26 NOTE — Telephone Encounter (Signed)
LM with pt's husband for pt to call back.  Please see MD message below if pt calls back.  Banks

## 2013-05-24 ENCOUNTER — Ambulatory Visit: Payer: 59 | Admitting: Family Medicine

## 2013-06-06 ENCOUNTER — Ambulatory Visit: Payer: 59 | Admitting: Family Medicine

## 2013-06-20 ENCOUNTER — Encounter: Payer: Self-pay | Admitting: Family Medicine

## 2013-06-20 ENCOUNTER — Ambulatory Visit (INDEPENDENT_AMBULATORY_CARE_PROVIDER_SITE_OTHER): Payer: 59 | Admitting: Family Medicine

## 2013-06-20 VITALS — BP 124/69 | HR 76 | Temp 98.7°F | Ht 61.0 in | Wt 162.8 lb

## 2013-06-20 DIAGNOSIS — M75100 Unspecified rotator cuff tear or rupture of unspecified shoulder, not specified as traumatic: Secondary | ICD-10-CM

## 2013-06-20 DIAGNOSIS — M25562 Pain in left knee: Secondary | ICD-10-CM

## 2013-06-20 DIAGNOSIS — Z0000 Encounter for general adult medical examination without abnormal findings: Secondary | ICD-10-CM

## 2013-06-20 DIAGNOSIS — M67919 Unspecified disorder of synovium and tendon, unspecified shoulder: Secondary | ICD-10-CM

## 2013-06-20 DIAGNOSIS — M25569 Pain in unspecified knee: Secondary | ICD-10-CM

## 2013-06-20 DIAGNOSIS — M719 Bursopathy, unspecified: Secondary | ICD-10-CM

## 2013-06-20 MED ORDER — GABAPENTIN 800 MG PO TABS: 800.0000 mg | ORAL_TABLET | Freq: Four times a day (QID) | ORAL | Status: DC

## 2013-06-20 NOTE — Progress Notes (Signed)
Patient ID: Teresa Shaffer, female   DOB: 1940/06/09, 73 y.o.   MRN: [id number]  Teresa File, MD Phone: [phone]  Subjective:  Chief complaint-noted  Pt Here for followup of right shoulder pain, left knee pain, and her annual physical exam   Left knee pain Has been bothering her for several months, she seen Dr. Oneida Alar, sports medicine, previously told her she had osteoarthritis. She does exercises at that time which she has not been doing. She states that when she does the exercises she has much improvement in her symptoms. She notes some "giving away" of her knee and she is really worried that she'll start falling.  Right shoulder pain Has been bothering her for about 6-8 months now. States that she does not remember any traumatic event or injury. 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 This morning she notes severe stiffness and pain and has started having to her here with her left hand. She was having problems before going to the beach.  Peripheral neuropathy She does not have diabetes and denies any alcohol abuse. She drinks about one glass of one per year she states. She's had this problem for years and describes it as bilateral tingling sensations in her feet and hands. It's improved with gabapentin she feels like she did better with a higher dose. She was previously on 800 mg and is now taking 600 mg 4 times a day  She had a normal mammogram in February  ROS-  Per history of present illness  Past Medical History Patient Active Problem List   Diagnosis Date Noted  . Chest pain 04/24/2013  . Right rotator cuff tendinitis 02/16/2013  . Sinusitis, acute 12/26/2012  . Cerumen impaction 09/02/2012  . Unsteady gait 09/02/2012  . 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 Pain of cervical spine 11/10/2011  . Hypertriglyceridemia 08/20/2011  . 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 Knee pain, left 08/04/2011  . 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 OSTEOPENIA 04/26/2007  . OBESITY, NOS 03/04/2006  . NEUROPATHY, PERIPHERAL 03/04/2006  . FIBROADENOSIS, BREAST 03/04/2006    Medications- reviewed and updated Current Outpatient Prescriptions  Medication Sig Dispense Refill  . acetaminophen (TYLENOL) 650 MG CR tablet Take 650 mg by mouth every 8 (eight) hours as needed.      Marland Kitchen amoxicillin (AMOXIL) 875 MG tablet Take 1 tablet (875 mg total) by mouth 2 (two) times daily.  20 tablet  0  . aspirin 81 MG tablet Take 81 mg by mouth daily.        . benzonatate (TESSALON) 100 MG capsule Take 1 capsule (100 mg total) by mouth 2 (two) times daily as needed for cough.  20 capsule  0  . beta carotene w/minerals (OCUVITE) tablet Take 1 tablet by mouth daily.      . Calcium Carbonate-Vitamin D (CALTRATE 600+D) 600-400 MG-UNIT per tablet Take 1 tablet by mouth 2 (two) times daily.        . Fish Oil OIL Take 1 capsule by mouth 2 (two) times daily.       . flunisolide (NASALIDE) 0.025 % SOLN 2 sprays by Nasal route daily.        Marland Kitchen gabapentin (NEURONTIN) 800 MG tablet Take 1 tablet (800 mg total) by mouth 4 (four) times daily.  120 tablet  11  . ipratropium (ATROVENT) 0.06 % nasal spray Place 2 sprays into both  nostrils 4 (four) times daily.  15 mL  12  . Multiple Vitamin (MULTIVITAMINS PO) Take one tablet daily.       Marland Kitchen zoster vaccine live, PF, (ZOSTAVAX) 73419 UNT/0.65ML injection Inject 19,400 Units into the skin once.  1 each  0   No current facility-administered medications for this visit.    Objective: BP 124/69  Pulse 76  Temp(Src) 98.7 F (37.1 C) (Oral)  Ht 5\' 1"  (1.549 m)  Wt 162 lb 12.8 oz (73.846 kg)  BMI 30.78 kg/m2 Gen: NAD, alert, cooperative with exam HEENT: NCAT, EOMI, PERRL CV: RRR, good S1/S2, no murmur Resp: CTABL, no wheezes, non-labored Abd: SNTND, BS present, no guarding or organomegaly Ext: No edema, warm Neuro: Alert and  oriented, No gross deficits MSK: Shoulders: Right shoulder with full range of motion on forward flexion, abduction, and overhead Apley scratch test, unable to/limitation with reaching for her scapula behind her back No pain with Hawkins, empty can test, or internal/external rotation.  Assessment/Plan:  Rotator cuff syndrome No signs of impingement today, only deficit in range of motion was with behind the back Apley scratch test. Given her severe symptoms currently and recent exacerbation from overworking I offered her a steroid injection which she declined. Considering that she's seen Dr. Oneida Alar before I recommended she followup with him for evaluation of her right shoulder and left knee. He gave her a handout and discussed home exercise plan to do in the meantime.  Knee pain, left Some worsening lately, she feels she is improved previously with her HEP and compression sleeve which she will start using again.     Meds ordered this encounter  Medications  . gabapentin (NEURONTIN) 800 MG tablet    Sig: Take 1 tablet (800 mg total) by mouth 4 (four) times daily.    Dispense:  120 tablet    Refill:  11

## 2013-06-20 NOTE — Assessment & Plan Note (Signed)
No signs of impingement today, only deficit in range of motion was with behind the back Apley scratch test. Given her severe symptoms currently and recent exacerbation from overworking I offered her a steroid injection which she declined. Considering that she's seen Dr. Oneida Alar before I recommended she followup with him for evaluation of her right shoulder and left knee. He gave her a handout and discussed home exercise plan to do in the meantime.

## 2013-06-20 NOTE — Patient Instructions (Signed)
Great to see you today!  Call the sports med clinic and ask for an appt with Dr. Oneida Alar.   Try the exercises  Come back to see me after February of next year unless you need to come in sooner.

## 2013-06-20 NOTE — Assessment & Plan Note (Signed)
Some worsening lately, she feels she is improved previously with her HEP and compression sleeve which she will start using again.

## 2013-07-26 ENCOUNTER — Ambulatory Visit (INDEPENDENT_AMBULATORY_CARE_PROVIDER_SITE_OTHER): Payer: 59 | Admitting: Sports Medicine

## 2013-07-26 ENCOUNTER — Encounter: Payer: Self-pay | Admitting: Sports Medicine

## 2013-07-26 VITALS — BP 108/69 | HR 77 | Ht 61.0 in | Wt 158.0 lb

## 2013-07-26 DIAGNOSIS — IMO0002 Reserved for concepts with insufficient information to code with codable children: Secondary | ICD-10-CM

## 2013-07-26 DIAGNOSIS — M25562 Pain in left knee: Secondary | ICD-10-CM

## 2013-07-26 DIAGNOSIS — M719 Bursopathy, unspecified: Secondary | ICD-10-CM

## 2013-07-26 DIAGNOSIS — M25511 Pain in right shoulder: Secondary | ICD-10-CM | POA: Insufficient documentation

## 2013-07-26 DIAGNOSIS — S29011A Strain of muscle and tendon of front wall of thorax, initial encounter: Secondary | ICD-10-CM

## 2013-07-26 DIAGNOSIS — M545 Low back pain, unspecified: Secondary | ICD-10-CM | POA: Insufficient documentation

## 2013-07-26 DIAGNOSIS — M75101 Unspecified rotator cuff tear or rupture of right shoulder, not specified as traumatic: Secondary | ICD-10-CM

## 2013-07-26 DIAGNOSIS — M67919 Unspecified disorder of synovium and tendon, unspecified shoulder: Secondary | ICD-10-CM

## 2013-07-26 DIAGNOSIS — M25519 Pain in unspecified shoulder: Secondary | ICD-10-CM

## 2013-07-26 DIAGNOSIS — M25569 Pain in unspecified knee: Secondary | ICD-10-CM

## 2013-07-26 HISTORY — DX: Low back pain, unspecified: M54.50

## 2013-07-26 HISTORY — DX: Pain in right shoulder: M25.511

## 2013-07-26 NOTE — Assessment & Plan Note (Signed)
Start PT  MRI and shoulder consult afterwards  We need to get an opinion but she is surprisingly functional even with large tear

## 2013-07-26 NOTE — Progress Notes (Signed)
Teresa Shaffer - 73 y.o. female MRN [id number]  Date of birth: [birth year]    SUBJECTIVE:     She presents today for evaluation of right shoulder pain and lower back pain, as well as followup of left knee pain.   Right shoulder pain - Reports sharp pain in the anterior right shoulder that radiates down her biceps that has been occurring for the last month.  She denies any trauma or previous surgeries and cannot recall any event when the pain began.  Pain has gotten progressively worse but she does have some relief with NSAIDs.  Pain is worse with movement, especially overhead movements, as well as lying on her right.  She has noticed decreased strength in shoulder abduction, and reports being unable to brush her dry her hair with her right arm.  She denies any erythema, or fevers; she does not have diabetes.   Low back pain - She reports lumbar back pain that began one month ago.  She denies any recent trauma or surgeries.  She describes the pain as "feeling unsteady" especially with going down steps.  He reports some concern that her mother had spinal stenosis or she may have this as well.  She denies any neuropathic symptoms, fevers, night sweats, history of cancer, recent infection, lower extremity weakness, or bowel or bladder incontinence.  Pain is worse with long periods of sitting.  She denied any pain with long periods of standing or walking.  Left knee pain - She reports chronic left knee pain associated with osteoarthritis.  The pain is unchanged.  He continues to be relieved with occasional NSAIDs.  She uses a left knee compression sleeve which helps, but admits that she could be more compliant with this.  She has not had a previous injections.  ROS:     See HPI  PERTINENT  PMH / PSH FH / / SH:  Past Medical, Surgical, Social, and Family History Reviewed & Updated in the EMR.  Pertinent findings include:   Osteopenia  OBJECTIVE: BP 108/69  Pulse 77  Ht 5\' 1"  (1.549 m)  Wt 158  lb (71.668 kg)  BMI 29.87 kg/m2  Physical Exam:  Vital signs are reviewed.  Right Shoulder: Inspection reveals no abnormalities, atrophy or asymmetry. Palpation: anterior shoulder tenderness; Moderate crepitus; no tenderness over AC joint  ROM: Internal rotation limited ~10 degrees; external rotation limited ~10 degrees; Able to fully abduct and shoulder flexion through painful  Shoulder abduction strength 4/5 Negative empty can sign. Normal scapular function observed. Painful arc sign present without drop arm sign. No apprehension sign  Back Exam: No erythema or malalignment noted;  SLR and XSLR seated: Negative Palpable tenderness:Mild midline tenderness w/o paraspinal muscle tension Lower extremity sensation, strength, reflexes intact  Left Knee: Normal to inspection with no erythema or effusion or obvious bony abnormalities. Palpation normal with no warmth, joint line tenderness, patellar tenderness, or condyle tenderness. ROM full in flexion and extension and lower leg rotation. Ligaments with solid consistent endpoints including ACL, PCL, LCL, MCL. Non painful patellar compression. Patellar glide with moderate crepitus. Hamstring and quadriceps strength is normal.   Right shoulder ultrasound - Large anterior shoulder effusion noted - Pectoralis major tendon appears to be partially torn allowing for biceps tendon subluxation - Unable to visualize subscapularis due to limited external rotation although this may be retracted and torn - Unable to visualize supraspinatus suspect full thickness tear with marked hypoechoic change and probable retraction - Infraspinatus and teres minor tendons both visualized  and intact  ASSESSMENT & PLAN:  See problem based charting & AVS for pt instructions.

## 2013-07-26 NOTE — Assessment & Plan Note (Addendum)
No concerning red flags; she was concerned as her mother has spinal stenosis however she has no neuropathic symptoms; She was also concerned about her balance as she continues to feel unsteady on her feet - She was pleased to hear that her symptoms were not consistent with spinal stenosis - She was instructed on several back stretches to perform at home - She has undergone physical therapy in the past; will followup with her in a couple weeks to reassess back pain and whether additional physical therapy as needed

## 2013-07-26 NOTE — Assessment & Plan Note (Signed)
Pain unchanged - Continues to have pain relief with occasional NSAIDs - Continue knee brace, which has proven beneficial

## 2013-07-26 NOTE — Assessment & Plan Note (Signed)
Ultrasound evidence of complete supraspinatus tear, as well as suspect partial tear of pectoralis major - Very limited shoulder internal and external rotation - Will obtain MRI right shoulder to further elucidate - Right shoulder PT - Followup after MRI results to discuss conservative versus surgical options - Continue pain control with NSAIDs as this is working for her

## 2013-08-01 ENCOUNTER — Other Ambulatory Visit: Payer: Self-pay | Admitting: *Deleted

## 2013-08-01 ENCOUNTER — Telehealth: Payer: Self-pay | Admitting: *Deleted

## 2013-08-01 MED ORDER — DIAZEPAM 5 MG PO TABS: ORAL_TABLET | ORAL | Status: DC

## 2013-08-01 NOTE — Telephone Encounter (Signed)
Faxed over rx

## 2013-08-03 ENCOUNTER — Ambulatory Visit
Admission: RE | Admit: 2013-08-03 | Discharge: 2013-08-03 | Disposition: A | Discharge status: Home / Self Care | Payer: Medicare Other | Source: Ambulatory Visit | Attending: Sports Medicine | Admitting: Sports Medicine

## 2013-08-03 DIAGNOSIS — S29011A Strain of muscle and tendon of front wall of thorax, initial encounter: Secondary | ICD-10-CM

## 2013-08-03 DIAGNOSIS — M75101 Unspecified rotator cuff tear or rupture of right shoulder, not specified as traumatic: Secondary | ICD-10-CM

## 2013-08-07 ENCOUNTER — Ambulatory Visit: Payer: 59

## 2013-08-09 ENCOUNTER — Ambulatory Visit: Payer: Medicare Other | Attending: Sports Medicine

## 2014-01-03 ENCOUNTER — Telehealth: Payer: Self-pay | Admitting: *Deleted

## 2014-01-03 NOTE — Telephone Encounter (Signed)
LVM for pt to call back to see about scheduling a nurse visit for flu shot. Teresa Shaffer, Teresa Shaffer

## 2014-06-27 ENCOUNTER — Other Ambulatory Visit: Payer: Self-pay | Admitting: *Deleted

## 2014-06-28 MED ORDER — GABAPENTIN 800 MG PO TABS: 800.0000 mg | ORAL_TABLET | Freq: Four times a day (QID) | ORAL | Status: DC

## 2014-09-06 ENCOUNTER — Encounter: Payer: Self-pay | Admitting: Family Medicine

## 2014-09-06 HISTORY — PX: COLONOSCOPY: SHX174

## 2014-10-25 ENCOUNTER — Other Ambulatory Visit: Payer: Self-pay | Admitting: Family Medicine

## 2014-10-25 ENCOUNTER — Encounter: Payer: Self-pay | Admitting: Family Medicine

## 2014-10-25 ENCOUNTER — Ambulatory Visit (INDEPENDENT_AMBULATORY_CARE_PROVIDER_SITE_OTHER): Payer: Medicare Other | Admitting: Family Medicine

## 2014-10-25 VITALS — BP 137/73 | HR 67 | Temp 98.0°F | Ht 60.0 in | Wt 170.3 lb

## 2014-10-25 DIAGNOSIS — Z23 Encounter for immunization: Secondary | ICD-10-CM | POA: Diagnosis not present

## 2014-10-25 DIAGNOSIS — M7501 Adhesive capsulitis of right shoulder: Secondary | ICD-10-CM

## 2014-10-25 DIAGNOSIS — M7500 Adhesive capsulitis of unspecified shoulder: Secondary | ICD-10-CM

## 2014-10-25 DIAGNOSIS — E669 Obesity, unspecified: Secondary | ICD-10-CM

## 2014-10-25 DIAGNOSIS — E781 Pure hyperglyceridemia: Secondary | ICD-10-CM

## 2014-10-25 DIAGNOSIS — J302 Other seasonal allergic rhinitis: Secondary | ICD-10-CM | POA: Diagnosis not present

## 2014-10-25 DIAGNOSIS — I10 Essential (primary) hypertension: Secondary | ICD-10-CM

## 2014-10-25 DIAGNOSIS — G609 Hereditary and idiopathic neuropathy, unspecified: Secondary | ICD-10-CM

## 2014-10-25 HISTORY — DX: Adhesive capsulitis of right shoulder: M75.01

## 2014-10-25 HISTORY — DX: Other seasonal allergic rhinitis: J30.2

## 2014-10-25 HISTORY — DX: Adhesive capsulitis of unspecified shoulder: M75.00

## 2014-10-25 LAB — BASIC METABOLIC PANEL
BUN: 23 mg/dL (ref 7–25)
CHLORIDE: 101 mmol/L (ref 98–110)
CO2: 29 mmol/L (ref 20–31)
Calcium: 9.4 mg/dL (ref 8.6–10.4)
Creat: 0.54 mg/dL — ABNORMAL LOW (ref 0.60–0.93)
Glucose, Bld: 91 mg/dL (ref 65–99)
Potassium: 4 mmol/L (ref 3.5–5.3)
SODIUM: 140 mmol/L (ref 135–146)

## 2014-10-25 LAB — LIPID PANEL
Cholesterol: 187 mg/dL (ref 125–200)
HDL: 43 mg/dL — ABNORMAL LOW (ref >=46)
LDL CALC: 112 mg/dL (ref <130)
Total CHOL/HDL Ratio: 4.3 Ratio (ref <=5.0)
Triglycerides: 158 mg/dL — ABNORMAL HIGH (ref <150)
VLDL: 32 mg/dL — ABNORMAL HIGH (ref <30)

## 2014-10-25 NOTE — Assessment & Plan Note (Signed)
Established problem Stable. Continue Gabapentin with supplemental oral magnesium.  Teresa Shaffer will attempt to reduce her gabapentin down to 2400 mg daily in distributed doses.

## 2014-10-25 NOTE — Assessment & Plan Note (Signed)
Established probvlem Further testing with fasting lipid panel

## 2014-10-25 NOTE — Progress Notes (Signed)
Subjective:    Patient ID: Teresa Shaffer, female    DOB: 1940-05-01, 74 y.o.   MRN: [id number]  HPI Problem List Items Addressed This Visit      Musculoskeletal and Integument   Right shoulder limited range of motion Onset: over a year ago Location: right shoulder Severity: interfering with ease of ADLs Duration: over a year Pattern: continuous Course: gradual onset, stable limitation currently Precipitant: After right rotator cuff repair surgery.  Initially had good ROM of shoulder after surgery and PT, but has gradually lost ROM Associated Symptoms: no significant pain in shoulder Trauma (Acute or Chronic): no acute trauma recently Prior Diagnostic Testing or Treatments: Previous PT on shouder after rotator cuff repair had resulted in Full AROM Relevant PMH/PSH: Rotator cuff surgery repair by Dr Mardelle Matte (Ortho)    Relevant Medications   naproxen sodium (ANAPROX) 220 MG tablet    Other Visit Diagnoses    Left Knee Pain - chronic problem for several years - Takes Aleve prn for pain - no limitation in function or sleep - No other joint swelling.  No joint erythema.  No gout hx.     Relevant Orders    Lipid Panel    Basic metabolic panel    Frozen shoulder syndrome, right        Relevant Medications    naproxen sodium (ANAPROX) 220 MG tablet    Other Relevant Orders    Ambulatory referral to Physical Therapy    Encounter for immunization        Need for vaccination with 13-polyvalent pneumococcal conjugate vaccine        Relevant Orders    Pneumococcal conjugate vaccine 13-valent IM (Completed)      Allergic Rhinitis Teresa Shaffer is here for evaluation of her seasonal allergic rhinitis. Patient's symptoms include clear rhinorrhea and nasal congestion. These symptoms are seasonal. The patient has been suffering from these symptoms for many years on and off. The patient has tried prescription nasal sprays with good relief of symptoms. Immunotherapy has never been  tried   Neuropathy She describes symptoms of discomfort. Onset of symptoms was several years ago Symptoms are currently of moderate severity. Symptoms occur in the evening and last overnight. The patient denies numbness. Symptoms are symmetric in feet.  Current treatment has included gabapentin 3200 mg daily in distributred doses.  She has started magnesium about 2 months a go to see if she can decrease her gabapentin dose. It, which has not improved symptoms.  Past Medical History  Diagnosis Date  . Sinusitis, acute 12/26/2012  . Right shoulder pain 07/26/2013  . 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 Pain of cervical spine 11/10/2011    left mid trapezius   . 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 LBP (low back pain) 07/26/2013  . 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 Minimal DJD changes on xray. Sunrise view wasn't done.    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 FIBROADENOSIS, BREAST 03/04/2006    Qualifier: Diagnosis of  By: Eusebio Friendly    . 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 Chest pain  04/24/2013  . Cerumen impaction 09/02/2012  . Frozen shoulder 10/25/2014  . Unsteady gait 09/02/2012   Past Surgical History  Procedure Laterality Date  . Tonsillectomy  1953  . Breast biopsy  1999    benign fibrocystic disease  . Colonoscopy w/ biopsies and polypectomy  12/2003    tubular adenoma  . Endometrial biopsy      benign  . Rotator cuff repair      Dr Mardelle Matte (Ortho)  . Colonoscopy  09/2014    No polyps, Dr Cleaster Corin.     reports that she has never smoked. She has never used smokeless tobacco. She reports that she drinks alcohol. She reports that she does not use illicit  drugs. family history includes Coronary artery disease in her father; Diabetes in her brother and father; Neuropathy in her brother, brother, and father.    Review of Systems  See HPI.  No weight loss No fever     Objective:   Physical Exam VS reviewed GEN: Alert, Cooperative, Groomed, NAD HEENT: PERRL; EAC bilaterally not occluded, TM's translucent with normal LM, (+) LR;                 COR: RRR, No M/G/R, No JVD, Normal PMI size and location LUNGS: BCTA, No Acc mm use, speaking in full sentences EXT: No peripheral leg edema. Hammer toes bilaterally.  Flat feet bilaterally. Corn on 2 nd toed.  MSK: pt using excessive scapulohumeral motion to abduct and to flex right shoulder. Right shoulder abd to ~ 30 degrees and flex to ~ 30 degrees.  SKIN: background erythema and telangectasia cheeks of face Neuro: Oriented to person, place, and time; Intact to touch in L4-S1 distribution of feet.  Gait: Normal speed, No significant path deviation, Step through +,  Psych: Normal affect/thought/speech/language        Assessment & Plan:

## 2014-10-25 NOTE — Patient Instructions (Addendum)
Shingles Vaccine (Zostavax) What is Shingles (also called Herpes Zoster)? A painful skin rash caused by the same virus that caused your chicken pox when you were a child.  The pain can be severe for some people and can last for 2 to 4 weeks and sometimes much longer.  Why does my doctor want me to get the shingles vaccination? It can prevent shingles in about half of people given the vaccine and in those patients who still get the shingles, the pain is less strong and will not last a long time.   What are the risks form the shingles vaccine? Severe reactions are rare.  Most common reactions are redness, soreness, swelling or itching at the injection site.   How do I get the Shingles vaccine (Zostavax) 1. Get a prescription for Zostavax from your doctor.  2. While all Medicare Part C & D plans* must cover all commercially available vaccines to prevent illness, some Part D plans may have special rules such as requiring prior authorization before they will cover the cost of Zostavax vaccine.  3. Take the prescription to your pharmacy.  The pharmacist will administer the vaccination.  The pharmacist will bill your Medicare plan.**    * Zostavax is not covered 100% by Medicare Part B plans.  Your co-pays may be from none to $100.  Please contact your insurance or pharmacist to find out if your Medicare Part B plan provides any coverage for Zostavax. **Consider the time of year if you may be reaching your Part D plans coverage limit (Donut hole).    Referral to [facility] for treatment of your right shoulder's limited range of motion was made.  We will contact you about an appointment when the referral has been approved by insurance.   Blood was drawn today for cholesterol levels and blood sugar.

## 2014-10-25 NOTE — Assessment & Plan Note (Signed)
Established problem. Stable. Continue current therapy Of Nasal spray

## 2014-10-25 NOTE — Assessment & Plan Note (Signed)
New problem No further work-up planned at this time Suspect it is due to lack of use of arm after rotator cuff repair Recommend trial of Physical Therapy where she had successful therapy after shoulder surgery previously, [place]. Pt agreed to referral to this PT group for evaluation and treatment.

## 2014-10-25 NOTE — Assessment & Plan Note (Signed)
Established Problem Stable. Family History of diabetes mellitus in first degree relatives. Checking fasting serum glucose on BMET today

## 2014-10-26 ENCOUNTER — Encounter: Payer: Self-pay | Admitting: Family Medicine

## 2014-10-26 ENCOUNTER — Telehealth: Payer: Self-pay | Admitting: Family Medicine

## 2014-10-26 NOTE — Telephone Encounter (Signed)
error 

## 2014-11-06 ENCOUNTER — Encounter: Payer: Self-pay | Admitting: Family Medicine

## 2014-11-07 ENCOUNTER — Other Ambulatory Visit: Payer: Self-pay

## 2014-11-07 DIAGNOSIS — Z1231 Encounter for screening mammogram for malignant neoplasm of breast: Secondary | ICD-10-CM

## 2014-11-10 ENCOUNTER — Ambulatory Visit (INDEPENDENT_AMBULATORY_CARE_PROVIDER_SITE_OTHER): Payer: Medicare Other | Admitting: Family Medicine

## 2014-11-10 VITALS — BP 102/70 | HR 76 | Temp 97.9°F | Resp 20 | Ht 61.0 in | Wt 172.4 lb

## 2014-11-10 DIAGNOSIS — H109 Unspecified conjunctivitis: Secondary | ICD-10-CM

## 2014-11-10 MED ORDER — TOBRAMYCIN-DEXAMETHASONE 0.3-0.1 % OP SUSP: 1.0000 [drp] | Freq: Three times a day (TID) | OPHTHALMIC | Status: DC | PRN

## 2014-11-10 NOTE — Patient Instructions (Signed)

## 2014-11-10 NOTE — Progress Notes (Signed)
This chart was scribed for Robyn Haber, MD by Moises Blood, medical scribe at [facility].The patient was seen in exam room 9 and the patient's care was started at 9:53 AM.  Patient ID: Teresa Shaffer MRN: [id number], DOB: [birth year], 74 y.o. Date of Encounter: 11/10/2014  Primary Physician: Acquanetta Sit, MD  Chief Complaint:  Chief Complaint  Patient presents with  . OTHER    seasonal allergies, eye infection, sinusitis     HPI:  Teresa Shaffer is a 74 y.o. female who presents to [facility] complaining of itchy eye pain, thought to be allergies, starting 2 weeks ago. She's been rubbing the area without relief.   Past Medical History  Diagnosis Date  . Sinusitis, acute 12/26/2012  . Right shoulder pain 07/26/2013  . 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 Pain of cervical spine 11/10/2011    left mid trapezius   . 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 LBP (low back pain) 07/26/2013  . 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 Minimal DJD changes on xray. Sunrise view wasn't done.    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 FIBROADENOSIS, BREAST 03/04/2006    Qualifier: Diagnosis of  By: Eusebio Friendly    . 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 Chest pain 04/24/2013  . Cerumen impaction 09/02/2012  . Frozen shoulder 10/25/2014  . Unsteady gait 09/02/2012  . Seasonal allergic rhinitis 10/25/2014  . 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 Authorizing Provider  aspirin 81 MG tablet Take 81 mg  by mouth daily.     Yes Historical Provider, MD  beta carotene w/minerals (OCUVITE) tablet Take 1 tablet by mouth daily.   Yes Historical Provider, MD  Calcium Carbonate-Vitamin D (CALTRATE 600+D) 600-400 MG-UNIT per tablet Take 1 tablet by mouth 2 (two) times daily.     Yes Historical Provider, MD  Fish Oil OIL Take 1 capsule by mouth 2 (two) times daily.    Yes Historical Provider, MD  flunisolide (NASALIDE) 0.025 % SOLN 2 sprays by Nasal route daily.     Yes Historical Provider, MD  gabapentin (NEURONTIN) 800 MG tablet Take 1 tablet (800 mg total) by mouth 4 (four) times daily. 06/28/14  Yes Timmothy Euler, MD  Multiple Vitamin (MULTIVITAMINS PO) Take one tablet daily.    Yes Historical Provider, MD  naproxen sodium (ANAPROX) 220 MG tablet Take 220 mg by mouth 2 (two) times daily with a meal.   Yes Historical Provider, MD    Allergies: No Known Allergies  Social History   Social History  . Marital Status: Married    Spouse Name: Eulas Post  . Number of Children: 1  . Years of Education: 16   Occupational History  . Retired [employer]  . Smoking status: Never Smoker   . Smokeless tobacco: Never Used  . Alcohol Use: 0.0 oz/week  0 drink(s) per week     Comment: rare alcohol  . Drug Use: No  . Sexual Activity:    Partners: Male   Other Topics Concern  . Not on file   Social History Narrative   Married to [place], second marriage   Son, Oletta Lamas, by first marriage, born 1966   Retired Education officer, museum      Health Care POA:    Emergency Contact: husband, Eulas Post, [phone]   End of Life Plan:    Who lives with you: husband   Any pets: none   Diet: Pt has a varied diet of protein, starch, and vegetables   Exercise: Pt has no regular exercise routine.   Seatbelts: Pt reports wearing seatbelt when in vehicles.    Sun Exposure/Protection: Pt reports using sun protection regularly.   Hobbies: reading, gardneing, sewing, volunteering at  church, sub. teaching                 Review of Systems: Constitutional: negative for fever, chills, night sweats, weight changes, or fatigue  HEENT: negative for hearing loss, congestion, rhinorrhea, ST, epistaxis, or sinus pressure; positive for eye pain, eye itching Cardiovascular: negative for chest pain or palpitations Respiratory: negative for hemoptysis, wheezing, shortness of breath, or cough Abdominal: negative for abdominal pain, nausea, vomiting, diarrhea, or constipation Dermatological: negative for rash Neurologic: negative for headache, dizziness, or syncope All other systems reviewed and are otherwise negative with the exception to those above and in the HPI.  Physical Exam: Blood pressure 102/70, pulse 76, temperature 97.9 F (36.6 C), temperature source Oral, resp. rate 20, height 5\' 1"  (1.549 m), weight 172 lb 6.4 oz (78.2 kg), SpO2 96 %., Body mass index is 32.59 kg/(m^2). General: Well developed, well nourished, in no acute distress. Head: Normocephalic, atraumatic, nares are without discharge. Bilateral auditory canals clear, TM's are without perforation, pearly grey and translucent with reflective cone of light bilaterally. Oral cavity moist, posterior pharynx without exudate, erythema, peritonsillar abscess, or post nasal drip; discharge in medial canvas of each eye, left eye was slightly more swollen and erythematous than right eye Neck: Supple. No thyromegaly. Full ROM. No lymphadenopathy. Extremities/Skin: Warm and dry. No clubbing or cyanosis. No edema. No rashes or suspicious lesions. Neuro: Alert and oriented X 3. Moves all extremities spontaneously. Gait is normal. CNII-XII grossly in tact. Psych:  Responds to questions appropriately with a normal affect.    ASSESSMENT AND PLAN:  74 y.o. year old female with  Signs of both allergic and bacterial conjunctivitis This chart was scribed in my presence and reviewed by me personally.    ICD-9-CM ICD-10-CM   1.  Bilateral conjunctivitis 372.30 H10.9 tobramycin-dexamethasone (TOBRADEX) ophthalmic solution     Signed, Robyn Haber, MD    By signing my name below, I, Moises Blood, attest that this documentation has been prepared under the direction and in the presence of Robyn Haber, MD. Electronically Signed: Moises Blood, [place]. 11/10/2014 , 9:53 AM .  Signed, Robyn Haber, MD 11/10/2014 9:53 AM

## 2014-12-04 ENCOUNTER — Other Ambulatory Visit: Payer: Self-pay | Admitting: Family Medicine

## 2014-12-04 ENCOUNTER — Telehealth: Payer: Self-pay | Admitting: Family Medicine

## 2014-12-04 DIAGNOSIS — H109 Unspecified conjunctivitis: Secondary | ICD-10-CM

## 2014-12-04 NOTE — Telephone Encounter (Signed)
Patient states left cap off and it spilled out

## 2014-12-04 NOTE — Telephone Encounter (Signed)
If the patient needs this still she needs re-evaluation here or with an eye specialist.

## 2014-12-04 NOTE — Telephone Encounter (Signed)
Patient needs a refill on her eye drops. She got them on 11/10/2014 however she left the cap off of them and it spilled out. Please advise.   [phone]

## 2014-12-05 MED ORDER — TOBRAMYCIN-DEXAMETHASONE 0.3-0.1 % OP SUSP: 1.0000 [drp] | Freq: Three times a day (TID) | OPHTHALMIC | Status: DC | PRN

## 2014-12-11 NOTE — Telephone Encounter (Signed)
Dr L refilled this.

## 2014-12-12 ENCOUNTER — Ambulatory Visit
Admission: RE | Admit: 2014-12-12 | Discharge: 2014-12-12 | Disposition: A | Discharge status: Home / Self Care | Payer: Medicare Other | Source: Ambulatory Visit

## 2014-12-12 DIAGNOSIS — Z1231 Encounter for screening mammogram for malignant neoplasm of breast: Secondary | ICD-10-CM

## 2014-12-18 ENCOUNTER — Telehealth: Payer: Self-pay | Admitting: *Deleted

## 2014-12-18 ENCOUNTER — Encounter: Payer: Self-pay | Admitting: Family Medicine

## 2014-12-18 DIAGNOSIS — Z66 Do not resuscitate: Secondary | ICD-10-CM

## 2014-12-18 HISTORY — DX: Do not resuscitate: Z66

## 2014-12-18 NOTE — Telephone Encounter (Signed)
Patient's husband informed that DNR form is complete and ready for pick up.  Form copied for scanning in record.  Derl Barrow, RN

## 2015-02-05 ENCOUNTER — Ambulatory Visit (INDEPENDENT_AMBULATORY_CARE_PROVIDER_SITE_OTHER): Payer: Medicare Other | Admitting: Family Medicine

## 2015-02-05 ENCOUNTER — Encounter: Payer: Self-pay | Admitting: Family Medicine

## 2015-02-05 VITALS — BP 106/68 | HR 72 | Temp 97.8°F | Wt 166.8 lb

## 2015-02-05 DIAGNOSIS — J069 Acute upper respiratory infection, unspecified: Secondary | ICD-10-CM

## 2015-02-05 MED ORDER — BENZONATATE 200 MG PO CAPS: 200.0000 mg | ORAL_CAPSULE | Freq: Three times a day (TID) | ORAL | Status: DC | PRN

## 2015-02-05 MED ORDER — OLOPATADINE HCL 0.2 % OP SOLN: 1.0000 [drp] | Freq: Every day | OPHTHALMIC | Status: DC

## 2015-02-05 NOTE — Progress Notes (Signed)
    Subjective:  Teresa Shaffer is a 75 y.o. female who presents to the [facility] today with a chief complaint of cough.   HPI:  Cough Symptoms started 4 days ago. Associated with rhinorrhea, sinus congestion, itchy watery eyes, and sneeze. No fevers or chills. No shortness of breath. Tried several medications including nasalide, saline spray, and benadryl which have not helped much. She has substituted at school a few times over the past 2 weeks but has not otherwise had any known sick contacts.  ROS: Per HPI  Objective:  Physical Exam: BP 106/68 mmHg  Pulse 72  Temp(Src) 97.8 F (36.6 C) (Oral)  Wt 166 lb 12.8 oz (75.66 kg)  Gen: NAD, resting comfortably HEENT: TMs clear bilaterally. OP clear. Maxillary sinuses transilluminate. CV: RRR with no murmurs appreciated Pulm: NWOB, CTAB with no crackles, wheezes, or rhonchi Skin: warm, dry Neuro: grossly normal, moves all extremities Psych: Normal affect and thought content  Assessment/Plan:  URI Symptoms consistent with viral etiology. No signs or symptoms of bacterial infection. Will treat symptomatically with tessalon for cough and pataday for watery eyes. Recommended 3 day course of OTC Afrin nasal spray for rhinorrhea. Return precautions reviewed.  Algis Greenhouse. Jerline Pain, [place] Resident PGY-2 02/05/2015 10:53 AM

## 2015-02-05 NOTE — Patient Instructions (Signed)
You have a cold. This can take 1-2 weeks to resolve. You can use over the counter Afrin nasal spray to help with your congestion and runny nose. Please only use this medication for 3 days.  We will send in eye drops and we will send in cough medicine.  Upper Respiratory Infection, Adult Most upper respiratory infections (URIs) are caused by a virus. A URI affects the nose, throat, and upper air passages. The most common type of URI is often called "the common cold." HOME CARE   Take medicines only as told by your doctor.  Gargle warm saltwater or take cough drops to comfort your throat as told by your doctor.  Use a warm mist humidifier or inhale steam from a shower to increase air moisture. This may make it easier to breathe.  Drink enough fluid to keep your pee (urine) clear or pale yellow.  Eat soups and other clear broths.  Have a healthy diet.  Rest as needed.  Go back to work when your fever is gone or your doctor says it is okay.  You may need to stay home longer to avoid giving your URI to others.  You can also wear a face mask and wash your hands often to prevent spread of the virus.  Use your inhaler more if you have asthma.  Do not use any tobacco products, including cigarettes, chewing tobacco, or electronic cigarettes. If you need help quitting, ask your doctor. GET HELP IF:  You are getting worse, not better.  Your symptoms are not helped by medicine.  You have chills.  You are getting more short of breath.  You have brown or red mucus.  You have yellow or brown discharge from your nose.  You have pain in your face, especially when you bend forward.  You have a fever.  You have puffy (swollen) neck glands.  You have pain while swallowing.  You have white areas in the back of your throat. GET HELP RIGHT AWAY IF:   You have very bad or constant:  Headache.  Ear pain.  Pain in your forehead, behind your eyes, and over your cheekbones (sinus  pain).  Chest pain.  You have long-lasting (chronic) lung disease and any of the following:  Wheezing.  Long-lasting cough.  Coughing up blood.  A change in your usual mucus.  You have a stiff neck.  You have changes in your:  Vision.  Hearing.  Thinking.  Mood. MAKE SURE YOU:   Understand these instructions.  Will watch your condition.  Will get help right away if you are not doing well or get worse.   This information is not intended to replace advice given to you by your health care provider. Make sure you discuss any questions you have with your health care provider.   Document Released: 06/10/2007 Document Revised: 05/08/2014 Document Reviewed: 03/29/2013 Elsevier Interactive Patient Education [employer].

## 2015-04-23 ENCOUNTER — Ambulatory Visit (INDEPENDENT_AMBULATORY_CARE_PROVIDER_SITE_OTHER): Payer: Medicare Other | Admitting: Family Medicine

## 2015-04-23 DIAGNOSIS — K130 Diseases of lips: Secondary | ICD-10-CM

## 2015-04-23 DIAGNOSIS — R220 Localized swelling, mass and lump, head: Secondary | ICD-10-CM

## 2015-04-23 NOTE — Progress Notes (Signed)
Patient left without being seen.

## 2015-04-24 ENCOUNTER — Ambulatory Visit (INDEPENDENT_AMBULATORY_CARE_PROVIDER_SITE_OTHER): Payer: Medicare Other | Admitting: Family Medicine

## 2015-04-24 ENCOUNTER — Encounter: Payer: Self-pay | Admitting: Family Medicine

## 2015-04-24 VITALS — BP 143/64 | HR 64 | Temp 97.7°F | Ht 61.0 in | Wt 166.0 lb

## 2015-04-24 DIAGNOSIS — J302 Other seasonal allergic rhinitis: Secondary | ICD-10-CM

## 2015-04-24 DIAGNOSIS — L309 Dermatitis, unspecified: Secondary | ICD-10-CM | POA: Diagnosis not present

## 2015-04-24 MED ORDER — HYDROCORTISONE 1 % EX OINT: 1.0000 "application " | TOPICAL_OINTMENT | Freq: Three times a day (TID) | CUTANEOUS | Status: DC

## 2015-04-24 MED ORDER — GABAPENTIN 800 MG PO TABS: 800.0000 mg | ORAL_TABLET | Freq: Four times a day (QID) | ORAL | Status: DC

## 2015-04-24 MED ORDER — OLOPATADINE HCL 0.1 % OP SOLN: 1.0000 [drp] | Freq: Two times a day (BID) | OPHTHALMIC | Status: DC

## 2015-04-24 MED ORDER — CETIRIZINE HCL 5 MG PO TABS
5.0000 mg | ORAL_TABLET | Freq: Every day | ORAL | Status: DC
Start: 2015-04-24 — End: 2017-08-06

## 2015-04-24 NOTE — Patient Instructions (Signed)
Sent in: - hydrocortisone - steroid ointment to use around your mouth. Apply three times daily for 1 week. Then switch to vaseline. - zyrtec - allergy pill. Take 5mg  daily - gabapentin - sent refill for you - patanol drops - sent in the ones you like  Also stay on the nasal spray you've been using. Use every single day.  Follow up if not improved in the next couple of weeks  Be well, Dr. Ardelia Mems

## 2015-04-24 NOTE — Progress Notes (Signed)
Date of Visit: 04/24/2015   HPI:  Patient presents for a same day appointment to discuss allergies and rash around mouth.  Eyes have been running for 5-6 weeks. No vision changes. No fever. Has tried benadryl without great relief. Sinuses are draining a lot. No shortness of breath or chest pain. Has tried pataday drops without good relief so she switched to twice daily patanol with better results, though not entirely resolved. Over the last 2.5 weeks symptoms have been a lot worse. Has used flunisolide nasal spray.  Has had discomfort around her mouth and erythema in this area. No sores inside mouth. Was previously licking lips a lot as she was [place] the beach and did not have access to lip balm. Has been applying some lip balm since it started.   ROS: See HPI  [place]: history of obesity, hyperlipidemia, adhesive capsulitis of R shoulder  PHYSICAL EXAM: BP 143/64 mmHg  Pulse 64  Temp(Src) 97.7 F (36.5 C) (Oral)  Ht 5\' 1"  (1.549 m)  Wt 166 lb (75.297 kg)  BMI 31.38 kg/m2 Gen: NAD, pleasant, cooperative HEENT: normocephalic, atraumatic, moist mucous membranes. Oropharynx clear and moist. Perioral skin is irritated and erythematous approximately 1cm around mouth. No lesions on lips or in mouth. Nares patent. Tympanic membranes clear bilaterally. No anterior cervical or supraclavicular lymphadenopathy. Pupils equal round and reactive to light. Extraocular movements intact. Mild scleral injection, no perilimbal erythema Heart: regular rate and rhythm, no murmur Lungs: clear to auscultation bilaterally, normal work of breathing  Neuro: alert, grossly nonfocal, speech normal  ASSESSMENT/PLAN:  Seasonal allergic rhinitis Uncontrolled. No signs of bacterial infection on exam today. Add zyrtec 5mg  daily. Continue patanol (sent in refill). Continue nasal steroid spray. Follow up if not improving.  Lip licking dermatitis No signs of superinfection with bacteria or any more sinister etiology. Will  rx hydrocortisone. Apply three times daily for 1 week. Then switch to vaseline. Follow up if not improving.    FOLLOW UP: Follow up as needed if symptoms worsen or fail to improve.    [place]. Ardelia Mems, [place]

## 2015-04-26 DIAGNOSIS — L309 Dermatitis, unspecified: Secondary | ICD-10-CM

## 2015-04-26 HISTORY — DX: Dermatitis, unspecified: L30.9

## 2015-04-26 NOTE — Assessment & Plan Note (Signed)
Uncontrolled. No signs of bacterial infection on exam today. Add zyrtec 5mg  daily. Continue patanol (sent in refill). Continue nasal steroid spray. Follow up if not improving.

## 2015-04-26 NOTE — Assessment & Plan Note (Signed)
No signs of superinfection with bacteria or any more sinister etiology. Will rx hydrocortisone. Apply three times daily for 1 week. Then switch to vaseline. Follow up if not improving.

## 2015-07-02 ENCOUNTER — Other Ambulatory Visit: Payer: Self-pay | Admitting: Family Medicine

## 2015-07-21 ENCOUNTER — Other Ambulatory Visit: Payer: Self-pay | Admitting: Family Medicine

## 2015-11-04 ENCOUNTER — Telehealth: Payer: Self-pay | Admitting: *Deleted

## 2015-11-04 NOTE — Telephone Encounter (Signed)
Called to offer AWV, however, patient did not have schedule with her. States she will call back to schedule.  Hubbard Hartshorn, RN, BSN

## 2015-11-19 ENCOUNTER — Other Ambulatory Visit: Payer: Self-pay | Admitting: Family Medicine

## 2016-01-20 ENCOUNTER — Other Ambulatory Visit: Payer: Self-pay | Admitting: Family Medicine

## 2016-01-20 NOTE — Telephone Encounter (Signed)
Patient needs appointment with Dr McDiarmid  before further refills  

## 2016-01-30 ENCOUNTER — Ambulatory Visit (INDEPENDENT_AMBULATORY_CARE_PROVIDER_SITE_OTHER): Payer: Medicare Other | Admitting: Family Medicine

## 2016-01-30 ENCOUNTER — Encounter: Payer: Self-pay | Admitting: Family Medicine

## 2016-01-30 VITALS — BP 118/66 | HR 72 | Temp 97.9°F | Ht 61.0 in | Wt 168.4 lb

## 2016-01-30 DIAGNOSIS — L309 Dermatitis, unspecified: Secondary | ICD-10-CM

## 2016-01-30 DIAGNOSIS — H6123 Impacted cerumen, bilateral: Secondary | ICD-10-CM

## 2016-01-30 DIAGNOSIS — H6502 Acute serous otitis media, left ear: Secondary | ICD-10-CM

## 2016-01-30 MED ORDER — AMOXICILLIN 500 MG PO CAPS: 500.0000 mg | ORAL_CAPSULE | Freq: Two times a day (BID) | ORAL | 0 refills | Status: DC

## 2016-01-30 NOTE — Progress Notes (Signed)
   Subjective: CC: ear issue [id number] C Huss is a 76 y.o. female presenting to clinic today for same day appointment. PCP: MCDIARMID,TODD D, MD Concerns today include:  1. Ear issue Patient reports a 2 weeks history of decreased hearing, left ear discomfort, watery eyes and congestion.  Denies fevers, chills, nausea, vomiting, diarrhea, sick contacts, SOB, wheeze.  She has been taking Zyrtec and Benadryl with minimal relief of symptoms.    2. Chapped lips Additionally, patient reports chapped lips.  She reports that they seem to improve with Vaseline but are worsened with topical abx or lip sticks.  Denies bleeding, exudate.  No Known Allergies  Social Hx reviewed: non smoker. MedHx, current medications and allergies reviewed.  Please see EMR. ROS: Per HPI  Objective: Office vital signs reviewed. BP 118/66   Pulse 72   Temp 97.9 F (36.6 C) (Oral)   Ht 5\' 1"  (1.549 m)   Wt 168 lb 6.4 oz (76.4 kg)   SpO2 94%   BMI 31.82 kg/m   Physical Examination:  General: Awake, alert, well nourished, No acute distress HEENT: Normal    Neck: No masses palpated. No lymphadenopathy    Ears: TMs initially occluded by cerumen bilaterally.  S/p irrigation Tympanic membranes intact, normal light reflex R, no erythema, no bulging on R.  L TM with bubbling behind TM.  Mild erythema at base. No purulence noted.  No tragal pain or mastoid TTP. No erythema or swelling of mastoid.    Eyes: EOMI, sclera white    Nose: nasal turbinates moist, no nasal discharge    Throat: moist mucus membranes, slight o/p erythema, no tonsillar exudate.  Airway is patent Cardio: regular rate and rhythm, S1S2 heard, no murmurs appreciated Pulm: clear to auscultation bilaterally, no wheezes, rhonchi or rales; normal work of breathing on room air  Assessment/ Plan: 77 y.o. female   1. Acute serous otitis media of left ear, recurrence not specified.  Afebrile and well appearing on exam.  Given discomfort and exam  findings will treat w/ abx. - Amox 500mg  BID x5 days - Ok to continue daily Zyrtec - Recommended AGAINST regular use of Benadryl given age - Flunisolide NS ok to continue for nasal symptoms - Return precautions reviewed  2. Bilateral impacted cerumen - s/p irrigation in clinic, which patient tolerated well, noting improved right hearing.  3. Lip licking dermatitis.  No evidence of bacterial infection.  - Continue topical barrier cream - If no improvement, would consider treating with mild topical steroid - Keep area dry   Follow up with PCP prn  Janora Norlander, DO PGY-3, [facility]

## 2016-01-30 NOTE — Patient Instructions (Signed)
Otitis Media, Adult Otitis media is redness, soreness, and puffiness (swelling) in the space just behind your eardrum (middle ear). It may be caused by allergies or infection. It often happens along with a cold. Follow these instructions at home:  Take your medicine as told. Finish it even if you start to feel better.  Only take over-the-counter or prescription medicines for pain, discomfort, or fever as told by your doctor.  Follow up with your doctor as told. Contact a doctor if:  You have otitis media only in one ear, or bleeding from your nose, or both.  You notice a lump on your neck.  You are not getting better in 3-5 days.  You feel worse instead of better. Get help right away if:  You have pain that is not helped with medicine.  You have puffiness, redness, or pain around your ear.  You get a stiff neck.  You cannot move part of your face (paralysis).  You notice that the bone behind your ear hurts when you touch it. This information is not intended to replace advice given to you by your health care provider. Make sure you discuss any questions you have with your health care provider. Document Released: 06/10/2007 Document Revised: 05/30/2015 Document Reviewed: 07/19/2012 Elsevier Interactive Patient Education  2017 Elsevier Inc.  

## 2016-02-06 ENCOUNTER — Other Ambulatory Visit: Payer: Self-pay | Admitting: Family Medicine

## 2016-02-06 NOTE — Telephone Encounter (Signed)
Pt was given antibotic last Thursday am and given antibotic for ear infection. She is better but ear is still bothering her.  Could another antibotic be called in?  Please advise

## 2016-02-07 MED ORDER — AMOXICILLIN-POT CLAVULANATE 875-125 MG PO TABS: 1.0000 | ORAL_TABLET | Freq: Two times a day (BID) | ORAL | 0 refills | Status: AC

## 2016-02-07 NOTE — Telephone Encounter (Signed)
Please let patient know that Augmentin (Amoxicillin-clavulanate) has been sent into her pharmacy.  She is to take twice a day for 7 days.  Please return to clinic if not improved after this course of antibiotics or if her condition worsens.

## 2016-02-07 NOTE — Telephone Encounter (Signed)
Pt informed of rx sent to her pharmacy, directions given. Pt voiced understanding for apt if sxs do not improve.

## 2016-02-17 ENCOUNTER — Encounter: Payer: Self-pay | Admitting: Family Medicine

## 2016-02-17 ENCOUNTER — Ambulatory Visit (INDEPENDENT_AMBULATORY_CARE_PROVIDER_SITE_OTHER): Payer: Medicare Other | Admitting: Family Medicine

## 2016-02-17 VITALS — BP 110/62 | HR 70 | Temp 97.9°F | Ht 61.0 in | Wt 167.6 lb

## 2016-02-17 DIAGNOSIS — H7292 Unspecified perforation of tympanic membrane, left ear: Secondary | ICD-10-CM

## 2016-02-17 HISTORY — DX: Unspecified perforation of tympanic membrane, left ear: H72.92

## 2016-02-17 MED ORDER — NEOMYCIN-POLYMYXIN-HC 3.5-10000-1 OT SUSP: 4.0000 [drp] | Freq: Three times a day (TID) | OTIC | 0 refills | Status: DC

## 2016-02-17 NOTE — Patient Instructions (Addendum)
It was a pleasure seeing you today in our clinic. Today we discussed your ear pain. Here is the treatment plan we have discussed and agreed upon together:   - use the ear drops 3 times a day, 4 drops each use. - Use the ear drops every day for the next 2 weeks.  - Come back to see [place] in 4 weeks to recheck this ear.

## 2016-02-17 NOTE — Progress Notes (Signed)
   HPI  CC: L ear drainage Patient is here with complaints of left-sided ear drainage. She states that she was diagnosed with a ear infection recently. At that time she was given amoxicillin, and then an additional short course of Augmentin. Most of her pain has resolved but she is still having drainage and hearing deficits on that left side. She denies any fevers or chills. The drainage she reports is clear. No blood. She is wondering if there is anything we can do.  Review of Systems    See HPI for ROS. All other systems reviewed and are negative.  CC, SH/smoking status, and VS noted  Objective: BP 110/62   Pulse 70   Temp 97.9 F (36.6 C) (Oral)   Ht 5\' 1"  (1.549 m)   Wt 167 lb 9.6 oz (76 kg)   SpO2 94%   BMI 31.67 kg/m  Gen: NAD, alert, cooperative, and pleasant. HEENT: NCAT, EOMI, PERRL, right TM clear, left TM thick and white with small perforation noted. No evidence of purulence or blood. No pain with manipulation of the auricle or jaw. CV: RRR, no murmur Resp: CTAB, no wheezes, non-labored   Assessment and plan:  Perforated tympanic membrane on examination, left Patient is here was on some symptoms consistent with a perforated tympanic membrane. Likely secondary to her previous acute otitis media. Since that time it appears that her otitis media has mostly resolved. Perforation persists but appears to be healing. - Strict avoidance of submerging head underwater or activities in which she would be at risk for this. - We discussed expectations for TM healing. Patient was made aware that this would be a long ongoing process in which her TM would eventually reseal and heal. Patient stated her understanding. - Per recommendations with patient's PCP, Dr. McDiarmid, Cortisporin otic suspension. 4 drops 3 times a day over the next 2 weeks.   Meds ordered this encounter  Medications  . neomycin-polymyxin-hydrocortisone (CORTISPORIN) 3.5-10000-1 otic suspension    Sig: Place 4  drops into the left ear 3 (three) times daily.    Dispense:  10 mL    Refill:  0     Elberta Leatherwood, MD,MS,  PGY3 02/17/2016 2:35 PM

## 2016-02-17 NOTE — Assessment & Plan Note (Signed)
Patient is here was on some symptoms consistent with a perforated tympanic membrane. Likely secondary to her previous acute otitis media. Since that time it appears that her otitis media has mostly resolved. Perforation persists but appears to be healing. - Strict avoidance of submerging head underwater or activities in which she would be at risk for this. - We discussed expectations for TM healing. Patient was made aware that this would be a long ongoing process in which her TM would eventually reseal and heal. Patient stated her understanding. - Per recommendations with patient's PCP, Dr. McDiarmid, Cortisporin otic suspension. 4 drops 3 times a day over the next 2 weeks.

## 2016-02-18 ENCOUNTER — Telehealth: Payer: Self-pay

## 2016-02-18 MED ORDER — SCOPOLAMINE 1 MG/3DAYS TD PT72: 1.0000 | MEDICATED_PATCH | TRANSDERMAL | 0 refills | Status: DC

## 2016-02-18 NOTE — Telephone Encounter (Signed)
Pt is going on a cruise would like the sea sick patches called in for her. Please let her know when this is done. Please call [phone]. Ottis Stain, CMA

## 2016-02-18 NOTE — Telephone Encounter (Signed)
Prior Authorization received from [employer] for scopolamine.  PA form placed in provider box for completion. Derl Barrow, RN

## 2016-02-18 NOTE — Telephone Encounter (Signed)
I spoke with Teresa Shaffer about risk with scopalamine patches including confusion and falls. She reports getting very ill/n/v whenever she is on a boat.  Recommended half patch at a time, telling others to watch her cognition and balance, let others know here she placed the patches, and to instruct them to remove the patch should side effects occur.

## 2016-03-04 ENCOUNTER — Other Ambulatory Visit: Payer: Self-pay | Admitting: Family Medicine

## 2016-05-28 ENCOUNTER — Telehealth: Payer: Self-pay | Admitting: Family Medicine

## 2016-05-28 NOTE — Telephone Encounter (Signed)
No answer. Left message asking if she would like to schedule AWV and to call back to discuss what it entails if she would like to schedule. - Teresa Shaffer

## 2016-08-27 ENCOUNTER — Encounter: Payer: Self-pay | Admitting: Family Medicine

## 2016-08-27 ENCOUNTER — Ambulatory Visit (INDEPENDENT_AMBULATORY_CARE_PROVIDER_SITE_OTHER): Payer: Medicare Other | Admitting: Family Medicine

## 2016-08-27 VITALS — BP 118/64 | HR 71 | Temp 98.2°F | Ht 61.0 in | Wt 175.0 lb

## 2016-08-27 DIAGNOSIS — R809 Proteinuria, unspecified: Secondary | ICD-10-CM

## 2016-08-27 DIAGNOSIS — M2011 Hallux valgus (acquired), right foot: Secondary | ICD-10-CM

## 2016-08-27 DIAGNOSIS — M1712 Unilateral primary osteoarthritis, left knee: Secondary | ICD-10-CM

## 2016-08-27 DIAGNOSIS — M2012 Hallux valgus (acquired), left foot: Secondary | ICD-10-CM

## 2016-08-27 DIAGNOSIS — Z79899 Other long term (current) drug therapy: Secondary | ICD-10-CM | POA: Diagnosis not present

## 2016-08-27 DIAGNOSIS — H7292 Unspecified perforation of tympanic membrane, left ear: Secondary | ICD-10-CM | POA: Diagnosis not present

## 2016-08-27 DIAGNOSIS — B0229 Other postherpetic nervous system involvement: Secondary | ICD-10-CM

## 2016-08-27 DIAGNOSIS — Z23 Encounter for immunization: Secondary | ICD-10-CM | POA: Diagnosis not present

## 2016-08-27 DIAGNOSIS — N6011 Diffuse cystic mastopathy of right breast: Secondary | ICD-10-CM

## 2016-08-27 DIAGNOSIS — N6012 Diffuse cystic mastopathy of left breast: Secondary | ICD-10-CM

## 2016-08-27 DIAGNOSIS — G609 Hereditary and idiopathic neuropathy, unspecified: Secondary | ICD-10-CM | POA: Diagnosis not present

## 2016-08-27 LAB — POCT URINALYSIS DIP (MANUAL ENTRY)
Bilirubin, UA: NEGATIVE
Blood, UA: NEGATIVE
Glucose, UA: NEGATIVE mg/dL
Ketones, POC UA: NEGATIVE mg/dL
NITRITE UA: NEGATIVE
PROTEIN UA: NEGATIVE mg/dL
Spec Grav, UA: 1.025 (ref 1.010–1.025)
UROBILINOGEN UA: 0.2 U/dL
pH, UA: 5.5 (ref 5.0–8.0)

## 2016-08-27 LAB — POCT UA - MICROSCOPIC ONLY

## 2016-08-27 NOTE — Patient Instructions (Addendum)
I believe you have some degenerative changes in the left knee on exam.  For now, continue using the Aleve up to twice a day. We are checking your kidneys, electrolyes and blood sugar today.  We will work on setting up an ENT specialty consultation for the possible perforate ear drum.   I ask Howell Rucks, RN to call you about setting up a Annual Wellness Visit.  You know what changes to make in your diet.  I like that your exercise program.   Go to your pharmacy for the New Shingrix (shingles) vaccination.

## 2016-08-28 ENCOUNTER — Encounter: Payer: Self-pay | Admitting: Family Medicine

## 2016-08-28 DIAGNOSIS — N6012 Diffuse cystic mastopathy of left breast: Secondary | ICD-10-CM

## 2016-08-28 DIAGNOSIS — N6011 Diffuse cystic mastopathy of right breast: Secondary | ICD-10-CM

## 2016-08-28 DIAGNOSIS — M2011 Hallux valgus (acquired), right foot: Secondary | ICD-10-CM

## 2016-08-28 DIAGNOSIS — H3530 Unspecified macular degeneration: Secondary | ICD-10-CM

## 2016-08-28 DIAGNOSIS — M2012 Hallux valgus (acquired), left foot: Secondary | ICD-10-CM

## 2016-08-28 HISTORY — DX: Hallux valgus (acquired), left foot: M20.12

## 2016-08-28 HISTORY — DX: Hallux valgus (acquired), right foot: M20.11

## 2016-08-28 HISTORY — DX: Diffuse cystic mastopathy of right breast: N60.11

## 2016-08-28 HISTORY — DX: Unspecified macular degeneration: H35.30

## 2016-08-28 LAB — BASIC METABOLIC PANEL
BUN / CREAT RATIO: 21 (ref 12–28)
BUN: 15 mg/dL (ref 8–27)
CO2: 25 mmol/L (ref 20–29)
CREATININE: 0.7 mg/dL (ref 0.57–1.00)
Calcium: 10.1 mg/dL (ref 8.7–10.3)
Chloride: 101 mmol/L (ref 96–106)
GFR, EST AFRICAN AMERICAN: 98 mL/min/{1.73_m2} (ref >59)
GFR, EST NON AFRICAN AMERICAN: 85 mL/min/{1.73_m2} (ref >59)
Glucose: 108 mg/dL — ABNORMAL HIGH (ref 65–99)
Potassium: 4.2 mmol/L (ref 3.5–5.2)
Sodium: 144 mmol/L (ref 134–144)

## 2016-08-28 NOTE — Assessment & Plan Note (Addendum)
Established problem worsened.  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 Currently, not significantly impairing lifestyle.  Pain and stiffness after prolonged sitting.  Improved with prn Aleve and leg muscle exercise (Recumbent bike). SCr stable despite use of Aleve.  Discussed nonpharmacologic, pharmacologic and surgical interventions. Patient wants tocontinue self-care with prn OTC analgesics and exercise.  Teresa Shaffer will let PCP know if Teresa Shaffer wishes to tryother approaches.

## 2016-08-28 NOTE — Assessment & Plan Note (Addendum)
Established problem. Stable. Continue current therapy with gabapentin. Tolerating it well.  No complications except prominent Hallucis Valgus bilaterally. No skin breakdown.  Arches preserved.  Discussed Podiatry surgery should bunions become symptomatic. Now they are onlycausing difficulty fitting shoes.

## 2016-08-28 NOTE — Progress Notes (Signed)
   Subjective:    Patient ID: Teresa Shaffer, female    DOB: [birth year], 76 y.o.   MRN: [id number] Teresa Shaffer is alone Sources of clinical information for visit is/are patient and past medical records. Nursing assessment for this office visit was reviewed with the patient for accuracy and revision.   HPI Problem List Items Addressed This Visit      Medium   idiopathic peripheral neuropathy - onset seveal years ago ; bilateral feet pain - persistent,stable - Gabapentin effective in decreasing pain - Grt toes deformities bilaterally.    Perforated tympanic membrane on examination, left - Primary - Onset February 2018 after diagnosis of AOM left.  Episode of left ear draingage with visualliztion of left TM perforation during [facility] exam.  - Valsalva maneuver associated with sensation air movement in left EAC - (+)decrease hearing in left ear.  - no drainage left eac - No otalgia   Knee pain, left - Onset 4 to 5 years ago. Xray at that time showed minimal left knee effusion. - Recent increase in pain. No trauma recall. Pain exac intermittent - Worse with prolonged sitting.  - Improves with Aleve which she takes about twice a day usaully and with use of recumbent bike at Y.  - left knee feels swollen to pt - no prior consultations other than with PCPs    Other Visit Diagnoses    Proteinuria, unspecified type  - Prior home AWV from insurer several years ago, patient told she had protein in her urine. - No hematuria, no leg edema, no highblood pressure.         SH: Smoking history reviewed. Exercise with recombent bike 2-4 times per week.    Review of Systems 11- point ROS form reviewed.     Objective:   Physical Exam VS reviewed GEN: Alert, Cooperative, Groomed, NAD HEENT: PERRL; Left TM occulded by cerumen unable to clear safely by ear currette,                No cervical LAN, No thyromegaly, No palpable masses COR: RRR, No M/G/R, No JVD, Normal PMI size and  location LUNGS: BCTA, No Acc mm use, speaking in full sentences EXT: No peripheral leg edema. Feet without deformity or lesions. Palpable bilateral pedal pulses.           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  SKIN: numerous sessile to small pedunculated skin tags about neck Neuro: Oriented in casual conversation; Moving all extremities symmetrically  Gait: Normal speed, No significant path deviation, Step through +,  Psych: Normal affect/thought/speech/language    Assessment & Plan:  See problem list

## 2016-08-28 NOTE — Assessment & Plan Note (Signed)
Established problem, unresolved. Unable to visualize left TM secondary to cerumen.  Pt reports air rush sensation left EAC with valsalva. Referral of patient to Vicie Mutters  MD (ENT) for evaluation & management for possible persistent left TM perforation with associated decreased hearing

## 2016-08-28 NOTE — Assessment & Plan Note (Addendum)
Recommend continued screening mammography given general good health making 10 years for further good to fair quality life expectancy quite possible.  Patient given information to schedule mammography at [facility].

## 2016-08-28 NOTE — Assessment & Plan Note (Signed)
Occasionaly brief episodes on pain lower abdomen she relates to her episode of zosters involving lower abdomen.  She manages. She is interested in Shingrix vaccination. Discussed sore arm after vaccination.  Rx given for patient to get Shingrix series at her pharmacy.

## 2016-08-28 NOTE — Assessment & Plan Note (Signed)
Established problem. Stable. Discussed role of surgical correction. Only impairing shoe fitting.  She will let PCP know if she wishes to pursue surgical options.

## 2016-08-31 ENCOUNTER — Encounter: Payer: Self-pay | Admitting: Family Medicine

## 2016-08-31 ENCOUNTER — Other Ambulatory Visit: Payer: Self-pay | Admitting: Family Medicine

## 2016-08-31 ENCOUNTER — Telehealth: Payer: Self-pay | Admitting: *Deleted

## 2016-08-31 DIAGNOSIS — Z1231 Encounter for screening mammogram for malignant neoplasm of breast: Secondary | ICD-10-CM

## 2016-08-31 NOTE — Telephone Encounter (Signed)
Returned pt message on [employer].  Pts wanted to know the status of her ENT appt:  We have sent the referral to [facility], they will contact her to schedule.  Here is their information...  Address: [address], [place], [place] [address] Phone: [phone]  When she returns call, ask if she is interested in an AWV with Lauren Lainy Wrobleski, Salome Spotted, [place]

## 2016-10-20 ENCOUNTER — Ambulatory Visit
Admission: RE | Admit: 2016-10-20 | Discharge: 2016-10-20 | Disposition: A | Discharge status: Home / Self Care | Payer: Medicare Other | Source: Ambulatory Visit | Attending: Family Medicine | Admitting: Family Medicine

## 2016-10-20 ENCOUNTER — Ambulatory Visit: Payer: Medicare Other | Admitting: *Deleted

## 2016-10-20 DIAGNOSIS — Z1231 Encounter for screening mammogram for malignant neoplasm of breast: Secondary | ICD-10-CM

## 2016-10-26 ENCOUNTER — Ambulatory Visit (HOSPITAL_COMMUNITY)
Admission: RE | Admit: 2016-10-26 | Discharge: 2016-10-26 | Disposition: A | Discharge status: Home / Self Care | Payer: Medicare Other | Source: Ambulatory Visit | Attending: Family Medicine | Admitting: Family Medicine

## 2016-10-26 ENCOUNTER — Encounter: Payer: Self-pay | Admitting: Family Medicine

## 2016-10-26 ENCOUNTER — Ambulatory Visit
Admission: RE | Admit: 2016-10-26 | Discharge: 2016-10-26 | Disposition: A | Discharge status: Home / Self Care | Payer: Medicare Other | Source: Ambulatory Visit | Attending: Family Medicine | Admitting: Family Medicine

## 2016-10-26 ENCOUNTER — Ambulatory Visit (INDEPENDENT_AMBULATORY_CARE_PROVIDER_SITE_OTHER): Payer: Medicare Other | Admitting: Family Medicine

## 2016-10-26 VITALS — BP 118/72 | HR 70 | Temp 97.7°F | Wt 177.0 lb

## 2016-10-26 DIAGNOSIS — Z01810 Encounter for preprocedural cardiovascular examination: Secondary | ICD-10-CM | POA: Diagnosis not present

## 2016-10-26 DIAGNOSIS — Z01818 Encounter for other preprocedural examination: Secondary | ICD-10-CM

## 2016-10-26 MED ORDER — SULFAMETHOXAZOLE-TRIMETHOPRIM 800-160 MG PO TABS: 1.0000 | ORAL_TABLET | Freq: Two times a day (BID) | ORAL | 0 refills | Status: DC

## 2016-10-26 NOTE — Assessment & Plan Note (Signed)
  Patient scheduled to have perforated ear-drum repaired by ENT on 11/16. Per their request she needs to have several screening tests done  -EKG done today -CXR ordered -CMP, CBC w/ diff, and PT/PTT ordered -will fax results to ENT office  -follow up with PCP as needed

## 2016-10-26 NOTE — Progress Notes (Signed)
    Subjective:    Patient ID: Teresa Shaffer, female    DOB: [birth year], 76 y.o.   MRN: [id number]   CC: here for surgical clearance  HPI: patient perforated ear drum roughly 8 months ago following ear infection. She has followed w/ ENT and the ear drum has not healed on its own. They planned surgery for November 16th. Per ENT patient needs surgical clearance.  She is doing well. Denies history of anesthetic complication in herself or family members. No chest pain, SOB, no DOE. She denies history of heart problems. No unintentional weight loss, fatigue, night sweats, vision changes, focal weakness, syncopal events. No hearing loss from perforation or ear pain.  Smoking status reviewed- non-smoker  Review of Systems- see HPI   Objective:  BP 118/72   Pulse 70   Temp 97.7 F (36.5 C) (Oral)   Wt 177 lb (80.3 kg)   SpO2 93%   BMI 33.44 kg/m  Vitals and nursing note reviewed  General: well nourished, in no acute distress HEENT: normocephalic, left TM perforated, right TM in tact with normal cone of light, no scleral icterus or conjunctival pallor, PERRL, EOMI, no nasal discharge, moist mucous membranes Neck: supple, non-tender, without lymphadenopathy Cardiac: RRR, clear S1 and S2, no murmurs, rubs, or gallops Respiratory: clear to auscultation bilaterally, no increased work of breathing Extremities: no edema or cyanosis, bilateral hammer toes  Skin: warm and dry, no rashes noted Neuro: alert and oriented, no focal deficits  Assessment & Plan:    Pre-op evaluation  Patient scheduled to have perforated ear-drum repaired by ENT on 11/16. Per their request she needs to have several screening tests done  -EKG done today -CXR ordered -CMP, CBC w/ diff, and PT/PTT ordered -will fax results to ENT office  -follow up with PCP as needed   Return as needed.  Lucila Maine, DO Family Medicine Resident PGY-2

## 2016-10-26 NOTE — Patient Instructions (Signed)
   It was nice to meet you today!  I'll send the lab results to your ENT doctor once we get them all back.  If you have questions or concerns please do not hesitate to call at [phone].  Lucila Maine, DO PGY-2, [facility] 10/26/2016 2:23 PM

## 2016-10-27 LAB — CBC WITH DIFFERENTIAL/PLATELET
BASOS: 0 %
Basophils Absolute: 0 10*3/uL (ref 0.0–0.2)
EOS (ABSOLUTE): 0.1 10*3/uL (ref 0.0–0.4)
EOS: 2 %
HEMATOCRIT: 39.9 % (ref 34.0–46.6)
Hemoglobin: 13.4 g/dL (ref 11.1–15.9)
IMMATURE GRANS (ABS): 0 10*3/uL (ref 0.0–0.1)
Immature Granulocytes: 0 %
Lymphocytes Absolute: 2.4 10*3/uL (ref 0.7–3.1)
Lymphs: 35 %
MCH: 30.2 pg (ref 26.6–33.0)
MCHC: 33.6 g/dL (ref 31.5–35.7)
MCV: 90 fL (ref 79–97)
MONOS ABS: 0.5 10*3/uL (ref 0.1–0.9)
Monocytes: 7 %
NEUTROS ABS: 3.8 10*3/uL (ref 1.4–7.0)
Neutrophils: 56 %
Platelets: 233 10*3/uL (ref 150–379)
RBC: 4.43 x10E6/uL (ref 3.77–5.28)
RDW: 14.5 % (ref 12.3–15.4)
WBC: 6.9 10*3/uL (ref 3.4–10.8)

## 2016-10-27 LAB — CMP14+EGFR
A/G RATIO: 1.8 (ref 1.2–2.2)
ALBUMIN: 4.4 g/dL (ref 3.5–4.8)
ALT: 46 IU/L — AB (ref 0–32)
AST: 40 IU/L (ref 0–40)
Alkaline Phosphatase: 103 IU/L (ref 39–117)
BILIRUBIN TOTAL: 0.4 mg/dL (ref 0.0–1.2)
BUN / CREAT RATIO: 30 — AB (ref 12–28)
BUN: 19 mg/dL (ref 8–27)
CALCIUM: 10.1 mg/dL (ref 8.7–10.3)
CO2: 23 mmol/L (ref 20–29)
Chloride: 100 mmol/L (ref 96–106)
Creatinine, Ser: 0.64 mg/dL (ref 0.57–1.00)
GFR, EST AFRICAN AMERICAN: 101 mL/min/{1.73_m2} (ref >59)
GFR, EST NON AFRICAN AMERICAN: 88 mL/min/{1.73_m2} (ref >59)
GLOBULIN, TOTAL: 2.5 g/dL (ref 1.5–4.5)
Glucose: 92 mg/dL (ref 65–99)
POTASSIUM: 4.5 mmol/L (ref 3.5–5.2)
Sodium: 141 mmol/L (ref 134–144)
TOTAL PROTEIN: 6.9 g/dL (ref 6.0–8.5)

## 2016-10-27 LAB — PT AND PTT
APTT: 31 s (ref 24–33)
INR: 1.1 (ref 0.8–1.2)
Prothrombin Time: 11.2 s (ref 9.1–12.0)

## 2016-11-17 HISTORY — PX: TYMPANOPLASTY WITH GRAFT: SHX6567

## 2016-11-18 ENCOUNTER — Encounter: Payer: Self-pay | Admitting: Family Medicine

## 2016-11-30 DIAGNOSIS — H906 Mixed conductive and sensorineural hearing loss, bilateral: Secondary | ICD-10-CM | POA: Insufficient documentation

## 2016-12-01 ENCOUNTER — Encounter: Payer: Self-pay | Admitting: Family Medicine

## 2017-07-07 ENCOUNTER — Other Ambulatory Visit: Payer: Self-pay | Admitting: Family Medicine

## 2017-08-05 ENCOUNTER — Other Ambulatory Visit: Payer: Self-pay

## 2017-08-05 ENCOUNTER — Encounter: Payer: Self-pay | Admitting: Family Medicine

## 2017-08-05 ENCOUNTER — Ambulatory Visit (INDEPENDENT_AMBULATORY_CARE_PROVIDER_SITE_OTHER): Payer: Medicare Other | Admitting: Family Medicine

## 2017-08-05 VITALS — BP 112/64 | HR 74 | Ht 61.0 in | Wt 173.0 lb

## 2017-08-05 DIAGNOSIS — R7303 Prediabetes: Secondary | ICD-10-CM

## 2017-08-05 DIAGNOSIS — K529 Noninfective gastroenteritis and colitis, unspecified: Secondary | ICD-10-CM

## 2017-08-05 DIAGNOSIS — H1010 Acute atopic conjunctivitis, unspecified eye: Secondary | ICD-10-CM

## 2017-08-05 DIAGNOSIS — J309 Allergic rhinitis, unspecified: Secondary | ICD-10-CM | POA: Diagnosis not present

## 2017-08-05 DIAGNOSIS — R197 Diarrhea, unspecified: Secondary | ICD-10-CM

## 2017-08-05 HISTORY — DX: Acute atopic conjunctivitis, unspecified eye: H10.10

## 2017-08-05 HISTORY — DX: Allergic rhinitis, unspecified: J30.9

## 2017-08-05 LAB — POCT GLYCOSYLATED HEMOGLOBIN (HGB A1C): HEMOGLOBIN A1C: 6 % — AB (ref 4.0–5.6)

## 2017-08-05 MED ORDER — OLOPATADINE HCL 0.1 % OP SOLN: 1.0000 [drp] | Freq: Two times a day (BID) | OPHTHALMIC | 5 refills | Status: DC

## 2017-08-05 NOTE — Patient Instructions (Signed)
We are testing for bad causes of the change in your BM habits. . If there is something unusual, Dr Amonte Brookover will call you. If everything is fine, Dr Jimmie Rueter will send you a letter with the test results.  Try using your steroid nasal spray twice a day for the next two weeks.  It is the first line therapy for allergic eyes.  Also use the antihistamine eye drops daily for next two weeks, then as needed for itching eyes.

## 2017-08-06 ENCOUNTER — Encounter: Payer: Self-pay | Admitting: Family Medicine

## 2017-08-06 DIAGNOSIS — K529 Noninfective gastroenteritis and colitis, unspecified: Secondary | ICD-10-CM | POA: Insufficient documentation

## 2017-08-06 DIAGNOSIS — Z9189 Other specified personal risk factors, not elsewhere classified: Secondary | ICD-10-CM | POA: Insufficient documentation

## 2017-08-06 HISTORY — DX: Noninfective gastroenteritis and colitis, unspecified: K52.9

## 2017-08-06 LAB — CBC WITH DIFFERENTIAL/PLATELET
Basophils Absolute: 0 10*3/uL (ref 0.0–0.2)
Basos: 0 %
EOS (ABSOLUTE): 0.1 10*3/uL (ref 0.0–0.4)
EOS: 1 %
HEMATOCRIT: 41.4 % (ref 34.0–46.6)
Hemoglobin: 13.4 g/dL (ref 11.1–15.9)
IMMATURE GRANS (ABS): 0 10*3/uL (ref 0.0–0.1)
IMMATURE GRANULOCYTES: 0 %
LYMPHS: 36 %
Lymphocytes Absolute: 2.5 10*3/uL (ref 0.7–3.1)
MCH: 29.9 pg (ref 26.6–33.0)
MCHC: 32.4 g/dL (ref 31.5–35.7)
MCV: 92 fL (ref 79–97)
MONOS ABS: 0.6 10*3/uL (ref 0.1–0.9)
Monocytes: 8 %
NEUTROS ABS: 3.9 10*3/uL (ref 1.4–7.0)
NEUTROS PCT: 55 %
Platelets: 254 10*3/uL (ref 150–450)
RBC: 4.48 x10E6/uL (ref 3.77–5.28)
RDW: 14.5 % (ref 12.3–15.4)
WBC: 7.1 10*3/uL (ref 3.4–10.8)

## 2017-08-06 LAB — CMP14+EGFR
ALK PHOS: 102 IU/L (ref 39–117)
ALT: 37 IU/L — ABNORMAL HIGH (ref 0–32)
AST: 45 IU/L — AB (ref 0–40)
Albumin/Globulin Ratio: 1.8 (ref 1.2–2.2)
Albumin: 4.4 g/dL (ref 3.5–4.8)
BUN/Creatinine Ratio: 30 — ABNORMAL HIGH (ref 12–28)
BUN: 22 mg/dL (ref 8–27)
Bilirubin Total: 0.4 mg/dL (ref 0.0–1.2)
CO2: 24 mmol/L (ref 20–29)
CREATININE: 0.73 mg/dL (ref 0.57–1.00)
Calcium: 10.3 mg/dL (ref 8.7–10.3)
Chloride: 99 mmol/L (ref 96–106)
GFR calc Af Amer: 93 mL/min/{1.73_m2} (ref >59)
GFR calc non Af Amer: 80 mL/min/{1.73_m2} (ref >59)
GLOBULIN, TOTAL: 2.5 g/dL (ref 1.5–4.5)
Glucose: 85 mg/dL (ref 65–99)
POTASSIUM: 4.9 mmol/L (ref 3.5–5.2)
SODIUM: 142 mmol/L (ref 134–144)
Total Protein: 6.9 g/dL (ref 6.0–8.5)

## 2017-08-06 LAB — TSH: TSH: 2.04 u[IU]/mL (ref 0.450–4.500)

## 2017-08-06 LAB — SEDIMENTATION RATE: Sed Rate: 27 mm/hr (ref 0–40)

## 2017-08-06 NOTE — Assessment & Plan Note (Signed)
Established problem worsened.  Restart nasal spray daily for at least two weeks Restart Patanol daily for two weeks at least May continue cetirizine daily-

## 2017-08-06 NOTE — Progress Notes (Signed)
   Subjective:    Patient ID: Teresa Shaffer, female    DOB: 08/15/1940, 77 y.o.   MRN: [id number] Terecia C Picariello is accompanied by husband Sources of clinical information for visit is/are patient and past medical records. Nursing assessment for this office visit was reviewed with the patient for accuracy and revision.  Previous Report(s) Reviewed: office notes  Depression screen PHQ 2/9 08/05/2017  Decreased Interest 0  Down, Depressed, Hopeless 0  PHQ - 2 Score 0   Fall Risk  08/05/2017 10/26/2016 08/27/2016 01/30/2016 10/25/2014  Falls in the past year? No No No No No    HPI  Problem List Items Addressed This Visit      Medium        Unprioritized   Allergic rhinoconjunctivitis - Onset several weeks ago - itching nose and both eyes - Using nasal spray sporadically, not using patanol, is using cetirizine - No rash, no fever   Relevant Medications   olopatadine (PATANOL) 0.1 % ophthalmic solution   Frequent stools - onset 2 months ago - stool freq 3 times a day - No hx of GI prblems - No laxative/stool softner use - No recnet abx - no alcohol, no hx pancreas disorder - Denies melena/BRBPR, denies weight loss - stools are formed but half the caliber they used to have - no straining, no pain, no flatulence,  - Colonoscopy 2016 (Dr Magod) - no polyps.  Pt with history of polyps.  - No abdominopelvic surgeries.  - No recent travel, no camping     CMP14+EGFR (Completed)   CBC with Differential/Platelet (Completed)   TSH (Completed)   Sedimentation Rate (Completed)   POCT glycosylated hemoglobin (Hb A1C) (Completed)   Fecal occult blood, imunochemical([facility])     SH: smoking hx reviewed   Review of Systems    see HPI Objective:   Physical Exam VS reviewed GEN: Alert, Cooperative, Groomed, NAD HEENT: PERRL; EAC bilaterally not occluded, TM's translucent with normal LM, (+) LR;                No cervical LAN, No thyromegaly, No palpable masses COR:  RRR, No M/G/R, No JVD, Normal PMI size and location LUNGS: BCTA, No Acc mm use, speaking in full sentences ABDOMEN: (+)BS, soft, NT, ND, No HSM, No palpable masses EXT: No peripheral leg edema.  SKIN: No lesion nor rashes of face/trunk/extremities Gait: Normal speed, No significant path deviation, Step through +,  Psych: Normal affect/thought/speech/language   Assessment & Plan:  Visit Problem List with A/P  No problem-specific Assessment & Plan notes found for this encounter.       

## 2017-08-06 NOTE — Assessment & Plan Note (Addendum)
New problem Further workup Uncertain prognosis Slightly elevated AST and ALT Lab Results  Component Value Date   ALT 37 (H) 08/05/2017        AST                                                               45 (H)                               08/05/2017  Broad Ddx: motility change, irritable colon, thyroid do, microscopic colitis, obstructing lesion given thinning of stool caliber, nonpathologic. Marland Kitchen

## 2017-08-06 NOTE — Assessment & Plan Note (Signed)
Lab Results  Component Value Date   HGBA1C 6.0 (A) 08/05/2017   Lifestyle interventions

## 2017-08-08 LAB — FECAL OCCULT BLOOD, IMMUNOCHEMICAL: Fecal Occult Bld: NEGATIVE

## 2017-08-09 ENCOUNTER — Telehealth: Payer: Self-pay | Admitting: Family Medicine

## 2017-08-09 DIAGNOSIS — Z9189 Other specified personal risk factors, not elsewhere classified: Secondary | ICD-10-CM

## 2017-08-16 NOTE — Telephone Encounter (Signed)
I spoke with Teresa Shaffer about Teresa Shaffer lab results  Elevated Hgb A1c 6.0 placing Teresa at risk of future diabetes (Father had diabetes)  - Discussed role of lifestyle changes with 5% weight loss and daily moderate intensity aerobic exercise. - Teresa Shaffer and Teresa Shaffer are joining a Tax adviser.  She has rode a recumbent bike in past but has "slacked off."  She feels motivated to restart this daily exercise.   - She feels that "sweets" have contributed significantly to Teresa weight gain.  This is the area she believes she can address.  - Will monitor lifestyle cahnges at next ov 9/26.  Mildly elevated transaminases - Teresa Shaffer denies drinking any alcohol.  Both she and Teresa husband do not drink. - Suspect this lab finding is related to Teresa 33% BMI -mild obesity with fatty liver disorder.  - We see if lifestyle changes above will result in normalization of LFTs.  Will check LFTs at next visit 9/26.  - Teresa Shaffer was not in [place], so has not been screened for HCV.  May screen if lifestyle changes working and transaminase remain elevated.   Frequent stools - Discussed reassuring nature of Teresa Shaffer unremarkable screening labs and FIT test.  Will monitor for other findings of significant disease cause of change in BM habits. No repeat colonoscopy at this time.

## 2017-09-30 ENCOUNTER — Ambulatory Visit (INDEPENDENT_AMBULATORY_CARE_PROVIDER_SITE_OTHER): Payer: Medicare Other | Admitting: Family Medicine

## 2017-09-30 ENCOUNTER — Encounter: Payer: Self-pay | Admitting: Family Medicine

## 2017-09-30 VITALS — BP 112/68 | HR 73 | Temp 98.0°F | Ht 61.0 in | Wt 170.0 lb

## 2017-09-30 DIAGNOSIS — Z9189 Other specified personal risk factors, not elsewhere classified: Secondary | ICD-10-CM

## 2017-09-30 DIAGNOSIS — K529 Noninfective gastroenteritis and colitis, unspecified: Secondary | ICD-10-CM

## 2017-09-30 DIAGNOSIS — Z23 Encounter for immunization: Secondary | ICD-10-CM

## 2017-09-30 NOTE — Patient Instructions (Signed)
None of your recent blood tests were abnormal except for just a very slight increase in your AST liver enzyme test.  This is most likely from your "Prediabetes" and will go away as you work on your lifestyle changes.   If you develop blood in your stool  Or more frequent stools, consistently watery stools, or abdominal pain with your stools, Please let Dr Jeffery Bachmeier or Dr Watt Climes know.    If you want to try to decrease the frequency of stool, you can try Immodium AD tablets, 1 to 2 tablets before each meal. No more than 6 tablets in a day.   Will see you back in 6 months.   Good Job with the 3 pound weight loss, keep it up!!

## 2017-10-03 ENCOUNTER — Encounter: Payer: Self-pay | Admitting: Family Medicine

## 2017-10-03 NOTE — Assessment & Plan Note (Signed)
Established problem that has improved.  While stool number has not changed, the consistency of stool is more formed.  The stool frequency does not seem to both patient. Gave red flags to return to [facility] or to contact Dr Watt Climes sooner RTC 6 -12 months.

## 2017-10-03 NOTE — Progress Notes (Signed)
Teresa Shaffer is alone Sources of clinical information for visit is/are patient and past medical records. Nursing assessment for this office visit was reviewed with the patient for accuracy and revision.  Previous Report(s) Reviewed: historical medical records  Depression screen [facility] 2/9 09/30/2017  Decreased Interest 0  Down, Depressed, Hopeless 0  PHQ - 2 Score 0   Fall Risk  09/30/2017 08/05/2017 10/26/2016 08/27/2016 01/30/2016  Falls in the past year? No No No No No    Prediabetes - no going to gym as planned. Very active with renovating a new house at [employer]. Has cut out cookies and sweet.   Diarrhea - Still with 3 stools a day but more formed.  No pain with BM.  No abdominal pains.  - No BRBPR nor melena. 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  - ast sl incr ==> no etoh, no hx hepatitis ROS: See HPI  VS reviewed GEN: Alert, Cooperative, Groomed, NAD

## 2017-10-03 NOTE — Assessment & Plan Note (Signed)
Established problem that has improved.  3 pounds weight loss Physically active but no formal exercise.  Consider stating physical activity that is enjoyable.

## 2017-11-07 ENCOUNTER — Other Ambulatory Visit: Payer: Self-pay | Admitting: Family Medicine

## 2017-12-08 ENCOUNTER — Ambulatory Visit: Payer: Medicare Other | Admitting: Family Medicine

## 2017-12-08 ENCOUNTER — Other Ambulatory Visit: Payer: Self-pay

## 2017-12-08 VITALS — BP 104/62 | HR 79 | Temp 98.1°F | Ht 61.0 in | Wt 165.6 lb

## 2017-12-08 DIAGNOSIS — J069 Acute upper respiratory infection, unspecified: Secondary | ICD-10-CM | POA: Diagnosis not present

## 2017-12-08 DIAGNOSIS — B9789 Other viral agents as the cause of diseases classified elsewhere: Secondary | ICD-10-CM

## 2017-12-08 NOTE — Patient Instructions (Signed)
It was a pleasure to see you today! Thank you for choosing [facility] for your primary care. Teresa Shaffer was seen for cough, sore throat.    [name] has a viral illness. There are no medications to cure the virus, but the body will clear the virus on its own with time. It's important to keep [name] well hydrated. It's ok if you do not want to eat much solid food during their illness, but you need to keep up with liquid intake.   Use ibuprofen to decrease throat pain.   Tylenol is safe as well.    Come back in 1 week if symptoms have not resolved.   Best,  Dr. Louis Matte,  Dr. Lindell Noe

## 2017-12-08 NOTE — Progress Notes (Signed)
   CC: Cough, sore throat  HPI  Sore throat- gargled with salt water lots during the day. Deep cough, ears have been popping. Two years ago she had AOM and it ruptured and she had surgery on that. L ear. No drainage from that ear. 2-3 days ago. No sick contacts. Husband is well. Flu shot this year already. No fevers, no chills or myalgias. No urinary concerns, drinking lots of water. Was having pain with swallowing until salt water gargles. hasnt had strep recently. Some sputum with coughs.   ROS: Denies CP, SOB, abdominal pain, dysuria, changes in BMs.   CC, SH/smoking status, and VS noted  Objective: BP 104/62   Pulse 79   Temp 98.1 F (36.7 C) (Oral)   Ht 5\' 1"  (1.549 m)   Wt 165 lb 9.6 oz (75.1 kg)   SpO2 93%   BMI 31.29 kg/m  Gen: NAD, alert, cooperative, and pleasant. HEENT: NCAT, EOMI, PERRL, erythematous nasal turbinates bilaterally, no oropharyngeal edema, TMs clear. No frontal or maxillary sinus tenderness.  CV: RRR, no murmur Resp: CTAB, no wheezes, non-labored Ext: No edema, warm Neuro: Alert and oriented, Speech clear, No gross deficits  Assessment and plan:  Cough, sore throat: Likely viral URI, Centor criteria -1.  Exam without concern for sinusitis or pneumonia.  Continue supportive care at home with salt water gargles, Tylenol and ibuprofen as needed.  Return in 1 week if not improved.  Ralene Ok, MD, PGY3 12/08/2017 1:52 PM

## 2018-02-03 ENCOUNTER — Other Ambulatory Visit: Payer: Self-pay

## 2018-02-07 ENCOUNTER — Ambulatory Visit: Payer: Medicare Other | Admitting: Family Medicine

## 2018-02-07 VITALS — BP 125/70 | HR 77 | Temp 98.3°F | Wt 169.8 lb

## 2018-02-07 DIAGNOSIS — T7840XA Allergy, unspecified, initial encounter: Secondary | ICD-10-CM | POA: Diagnosis not present

## 2018-02-07 DIAGNOSIS — J309 Allergic rhinitis, unspecified: Secondary | ICD-10-CM

## 2018-02-07 DIAGNOSIS — H1010 Acute atopic conjunctivitis, unspecified eye: Secondary | ICD-10-CM | POA: Diagnosis not present

## 2018-02-07 HISTORY — DX: Allergy, unspecified, initial encounter: T78.40XA

## 2018-02-07 MED ORDER — OLOPATADINE HCL 0.1 % OP SOLN: 1.0000 [drp] | Freq: Two times a day (BID) | OPHTHALMIC | 5 refills | Status: DC

## 2018-02-07 MED ORDER — GABAPENTIN 800 MG PO TABS: 800.0000 mg | ORAL_TABLET | Freq: Four times a day (QID) | ORAL | 11 refills | Status: DC

## 2018-02-07 MED ORDER — FLUNISOLIDE 25 MCG/ACT (0.025%) NA SOLN: 2.0000 | Freq: Two times a day (BID) | NASAL | 99 refills | Status: DC | PRN

## 2018-02-07 MED FILL — OLOPATADINE HCL 0.1% EYE DR: 0.1 | 25 days supply | Qty: 5 | Fill #0

## 2018-02-07 MED FILL — GABAPENTIN 800 MG TABLET: 800 | 30 days supply | Qty: 120 | Fill #0 | Status: TO

## 2018-02-07 NOTE — Progress Notes (Signed)
   HPI 78 year old female who presents for presumed allergic reaction.  Patient was in her usual state of health until the afternoon of 02/06/2018.  She states that approximately 10 to 15 minutes after eating lunch she developed perioral rash.  It was very pruritic, erythematous.  She did not have any mouth or throat swelling denies any shortness of breath.  She denies swelling in any other area.  She ate tacos from [employer] and states that she had the exact same order that she usually has.  She had the exact same meal 1 week prior with no changes.  She states they got the order to go and she had a meal with diet ginger ale or Coke.  She has had both these beverages many many times.  States that she had a similar reaction a few years ago when she was eating Asian food.  She has since stopped eating that dish and has had no issues.  She does not know which ingredient caused it, but she thinks it is a spice.  Patient is been taking Benadryl 3 times per day and states that her swelling is gone down quite a bit.  CC: Allergic reaction   ROS:   Review of Systems See HPI for ROS.   CC, SH/smoking status, and VS noted  Objective: BP 125/70   Pulse 77   Temp 98.3 F (36.8 C) (Oral)   Wt 169 lb 12.8 oz (77 kg)   SpO2 96%   BMI 32.08 kg/m  Gen: Very pleasant 78 year old Caucasian female, resting comfortably, no acute distress HEENT: Erythematous perioral rash with market dryness from surrounding skin, intensely pruritic. CV: RRR, no murmur Resp: CTAB, no wheezes, non-labored Neuro: Alert and oriented, Speech clear, No gross deficits   Assessment and plan:  Allergic reaction Likely a food allergy.  Given her symptoms started approximately 10 to 15 minutes after ingestion likely was ingredient in the tacos she was eating.  Counseled patient against eating this particular food.  If she has another reaction advised patient to write down everything she had to eat with in contact with that day to  try and find a pattern.  Continue Benadryl as needed for rash.  Allergic rhinoconjunctivitis Refilled Patanol.   No orders of the defined types were placed in this encounter.   Meds ordered this encounter  Medications  . olopatadine (PATANOL) 0.1 % ophthalmic solution    Sig: Place 1 drop into both eyes 2 (two) times daily.    Dispense:  5 mL    Refill:  5     Guadalupe Dawn MD PGY-2 Family Medicine Resident  02/08/2018 1:08 PM

## 2018-02-07 NOTE — Assessment & Plan Note (Addendum)
Likely a food allergy.  Given her symptoms started approximately 10 to 15 minutes after ingestion likely was ingredient in the tacos she was eating.  Counseled patient against eating this particular food.  If she has another reaction advised patient to write down everything she had to eat with in contact with that day to try and find a pattern.  Continue Benadryl as needed for rash.

## 2018-02-07 NOTE — Patient Instructions (Signed)
It was [place] seeing you today!  I am sorry you had so much trouble with the apparent allergic reaction you are having with your mouth.  Given that your symptoms started almost immediately after eating Janine Limbo, I think it was due to either a Spice or ingredient used in the tacos you were eating.  I would avoid eating the same food that triggered this, and likely avoid Janine Limbo altogether.  Typically the symptoms should resolve after 3 to 4 days.  He can continue to take Benadryl, 3 times per day.  This can cause urinary retention and people greater than 70 so please be on the look out for this.  Please let me know if you have any problems urinating.  I refilled your Patanol [facility]

## 2018-02-08 NOTE — Assessment & Plan Note (Signed)
Refilled Patanol.

## 2018-02-23 MED FILL — FLUNISOLIDE 0.025% SPRAY: 25 MCG/ACT | 30 days supply | Qty: 25 | Fill #0 | Status: TO

## 2018-03-22 ENCOUNTER — Encounter: Payer: Self-pay | Admitting: Family Medicine

## 2018-03-22 NOTE — Progress Notes (Signed)
OV note from [facility] Allergy, asthma and sinus care. Physician: Loel Lofty MD DOV: 03/11/18  CC: New Allergy evaluation CC: lip swelling  Hx episodic lip swelling & tingling after eating shrimp, chicken, steak at [employer].  Subsequent ingestion of foods without recurrent lip sxs.  Hx episode after eating beef taco.  No hx of problem eating taco. Lip Blisters came lasting 10-14 days.   Office food allergy skin testing was negative.   Skin testing for spice allergy was negative. Labs to double check spice allergy were drawn.    A/ Rash and other nospecific skin eruption - location rash inner and outer mucosa concern for viral origin - other possiblity of Eylea eye injections for M. Degeneration which were started right before onset of sxs.   Allergic Rhinitis Other chronic allergic conjunctivitis  P/  Food symptom diary Check with Eye doctor to see if they have seen this type of rxn before.   Continue Flunisolide nasal spray Continue Benadryl allergy table t25 mg 1 qhs prn Continue Olopatadine eye drops

## 2018-03-29 MED FILL — GABAPENTIN 800 MG TABS: 800 | 30 days supply | Qty: 120 | Fill #0

## 2018-05-09 MED FILL — GABAPENTIN 800 MG TABS: 800 | 30 days supply | Qty: 120 | Fill #1

## 2018-05-09 MED FILL — FLUNISOLIDE 0.025% SPRAY: 25 MCG/ACT | 30 days supply | Qty: 25 | Fill #0

## 2018-06-21 MED FILL — AZITHROMYCIN 500 MG TABLET: 500 | 3 days supply | Qty: 3 | Fill #0

## 2018-06-21 MED FILL — HYDROCODON-APAP 7.5-325: 7.5-325 | 2 days supply | Qty: 8 | Fill #0

## 2018-07-29 MED FILL — GABAPENTIN 800 MG TABLET: 800 | 30 days supply | Qty: 120 | Fill #0

## 2018-07-29 MED FILL — FLUNISOLIDE 0.025% SPRAY: 25 MCG/ACT | 30 days supply | Qty: 25 | Fill #0

## 2018-09-14 MED FILL — FLUNISOLIDE 0.025% SPRAY: 25 MCG/ACT | 30 days supply | Qty: 25 | Fill #0

## 2018-09-14 MED FILL — GABAPENTIN 800 MG TABS: 800 | 30 days supply | Qty: 120 | Fill #0

## 2018-10-10 MED FILL — GABAPENTIN 800 MG TABS: 800 | 30 days supply | Qty: 120 | Fill #1

## 2018-10-10 MED FILL — FLUNISOLIDE 0.025% SPRAY: 25 MCG/ACT | 30 days supply | Qty: 25 | Fill #1

## 2018-10-26 MED FILL — CHLORHEXIDINE 0.12% RINSE: 0.12 | 16 days supply | Qty: 473 | Fill #0

## 2018-10-26 MED FILL — AZITHROMYCIN 250 MG TABS: 250 | 1 days supply | Qty: 2 | Fill #0

## 2018-11-09 MED FILL — FLUNISOLIDE 0.025% SPRAY: 25 MCG/ACT | 30 days supply | Qty: 25 | Fill #2

## 2018-11-09 MED FILL — GABAPENTIN 800 MG TABS: 800 | 30 days supply | Qty: 120 | Fill #2

## 2018-11-18 MED FILL — CHLORHEXIDINE 0.12% RINSE: 0.12 | 16 days supply | Qty: 473 | Fill #0

## 2018-11-18 MED FILL — AZITHROMYCIN 250 MG TABS: 250 | 1 days supply | Qty: 2 | Fill #0

## 2018-12-13 ENCOUNTER — Encounter: Payer: Self-pay | Admitting: Family Medicine

## 2018-12-13 ENCOUNTER — Other Ambulatory Visit: Payer: Self-pay

## 2018-12-13 ENCOUNTER — Ambulatory Visit: Payer: Medicare Other | Admitting: Family Medicine

## 2018-12-13 VITALS — BP 120/60 | HR 76 | Ht 61.0 in | Wt 172.5 lb

## 2018-12-13 DIAGNOSIS — R21 Rash and other nonspecific skin eruption: Secondary | ICD-10-CM

## 2018-12-13 DIAGNOSIS — J309 Allergic rhinitis, unspecified: Secondary | ICD-10-CM | POA: Diagnosis not present

## 2018-12-13 DIAGNOSIS — H1010 Acute atopic conjunctivitis, unspecified eye: Secondary | ICD-10-CM

## 2018-12-13 HISTORY — DX: Rash and other nonspecific skin eruption: R21

## 2018-12-13 MED ORDER — HYDROCORTISONE 1 % EX OINT: 1.0000 "application " | TOPICAL_OINTMENT | Freq: Two times a day (BID) | CUTANEOUS | 0 refills | Status: DC

## 2018-12-13 MED ORDER — FLUNISOLIDE 25 MCG/ACT (0.025%) NA SOLN: 2.0000 | Freq: Two times a day (BID) | NASAL | 1 refills | Status: DC | PRN

## 2018-12-13 MED ORDER — OLOPATADINE HCL 0.1 % OP SOLN: 1.0000 [drp] | Freq: Two times a day (BID) | OPHTHALMIC | 5 refills | Status: DC

## 2018-12-13 MED ORDER — GABAPENTIN 800 MG PO TABS: 800.0000 mg | ORAL_TABLET | Freq: Four times a day (QID) | ORAL | 11 refills | Status: DC

## 2018-12-13 MED FILL — OLOPATADINE HCL 0.1% EYE DR: 0.1 | 18 days supply | Qty: 5 | Fill #0 | Status: TO

## 2018-12-13 MED FILL — FLUNISOLIDE 0.025% SPRAY: 25 MCG/ACT | 30 days supply | Qty: 25 | Fill #0

## 2018-12-13 MED FILL — GABAPENTIN 800 MG TABLET: 800 | 30 days supply | Qty: 120 | Fill #0 | Status: TO

## 2018-12-13 MED FILL — FLUNISOLIDE 0.025% SPRAY: 25 MCG/ACT | 30 days supply | Qty: 25 | Fill #0 | Status: TO

## 2018-12-13 MED FILL — OLOPATADINE HCL 0.1 % SOLN: 0.1 | 25 days supply | Qty: 5 | Fill #0

## 2018-12-13 MED FILL — GABAPENTIN 800 MG TABS: 800 | 30 days supply | Qty: 120 | Fill #0

## 2018-12-13 NOTE — Patient Instructions (Signed)
Rash, Adult  A rash is a change in the color of your skin. A rash can also change the way your skin feels. There are many different conditions and factors that can cause a rash. Follow these instructions at home: The goal of treatment is to stop the itching and keep the rash from spreading. Watch for any changes in your symptoms. Let your doctor know about them. Follow these instructions to help with your condition: Medicine Take or apply over-the-counter and prescription medicines only as told by your doctor. These may include medicines:  To treat red or swollen skin (corticosteroid creams).  To treat itching.  To treat an allergy (oral antihistamines).  To treat very bad symptoms (oral corticosteroids).  Skin care  Put cool cloths (compresses) on the affected areas.  Do not scratch or rub your skin.  Avoid covering the rash. Make sure that the rash is exposed to air as much as possible. Managing itching and discomfort  Avoid hot showers or baths. These can make itching worse. A cold shower may help.  Try taking a bath with: ? Epsom salts. You can get these at your local pharmacy or grocery store. Follow the instructions on the package. ? Baking soda. Pour a small amount into the bath as told by your doctor. ? Colloidal oatmeal. You can get this at your local pharmacy or grocery store. Follow the instructions on the package.  Try putting baking soda paste onto your skin. Stir water into baking soda until it gets like a paste.  Try putting on a lotion that relieves itchiness (calamine lotion).  Keep cool and out of the sun. Sweating and being hot can make itching worse. General instructions   Rest as needed.  Drink enough fluid to keep your pee (urine) pale yellow.  Wear loose-fitting clothing.  Avoid scented soaps, detergents, and perfumes. Use gentle soaps, detergents, perfumes, and other cosmetic products.  Avoid anything that causes your rash. Keep a journal to  help track what causes your rash. Write down: ? What you eat. ? What cosmetic products you use. ? What you drink. ? What you wear. This includes jewelry.  Keep all follow-up visits as told by your doctor. This is important. Contact a doctor if:  You sweat at night.  You lose weight.  You pee (urinate) more than normal.  You pee less than normal, or you notice that your pee is a darker color than normal.  You feel weak.  You throw up (vomit).  Your skin or the whites of your eyes look yellow (jaundice).  Your skin: ? Tingles. ? Is numb.  Your rash: ? Does not go away after a few days. ? Gets worse.  You are: ? More thirsty than normal. ? More tired than normal.  You have: ? New symptoms. ? Pain in your belly (abdomen). ? A fever. ? Watery poop (diarrhea). Get help right away if:  You have a fever and your symptoms suddenly get worse.  You start to feel mixed up (confused).  You have a very bad headache or a stiff neck.  You have very bad joint pains or stiffness.  You have jerky movements that you cannot control (seizure).  Your rash covers all or most of your body. The rash may or may not be painful.  You have blisters that: ? Are on top of the rash. ? Grow larger. ? Grow together. ? Are painful. ? Are inside your nose or mouth.  You have a rash   that: ? Looks like purple pinprick-sized spots all over your body. ? Has a "bull's eye" or looks like a target. ? Is red and painful, causes your skin to peel, and is not from being in the sun too long. Summary  A rash is a change in the color of your skin. A rash can also change the way your skin feels.  The goal of treatment is to stop the itching and keep the rash from spreading.  Take or apply over-the-counter and prescription medicines only as told by your doctor.  Contact a doctor if you have new symptoms or symptoms that get worse.  Keep all follow-up visits as told by your doctor. This is  important. This information is not intended to replace advice given to you by your health care provider. Make sure you discuss any questions you have with your health care provider. Document Released: 06/10/2007 Document Revised: 04/15/2018 Document Reviewed: 07/26/2017 Elsevier Patient Education  2020 Elsevier Inc.  

## 2018-12-13 NOTE — Assessment & Plan Note (Signed)
Patient presenting with pruritic rash around the eyes and nose and philtrum.  It is scaly as well.  It is nontender, does not appear infected.  Appears to be some type of irritant contact dermatitis, cannot rule out fungal infection.  There are no discrete raised lesions is mostly confluent.  Patient also recently returned from the beach, possible that she had some unexpected sun exposure causing burn, although it is nonpainful.  Recommend trial of a low potency hydrocortisone to see if she has any improvement.  Patient follow-up in 1 week.  Patient is to be no ocular involvement.  Patient does have chronic allergic rhinoconjunctivitis.  I have refilled her medications including her eyedrops.  Patient follow-up if no improvement with this either.

## 2018-12-13 NOTE — Progress Notes (Signed)
Subjective:  Teresa Shaffer is a 78 y.o. female who presents to the [facility] today with a chief complaint of itchy, red, swollen eyes.   HPI:  Patient reports that she has had red and itchy eyes for a long time.  They have been particularly bad since the beginning of fall.  Patient is prescribed multiple allergic medications including nasal steroid, cetirizine, Patanol eyedrops.  Patient does not use the Patanol eyedrops in a long time.  Patient reports that she has had itching around her eyes as well with the skin around them.  She has been rubbing them at night.  Is been worse the past couple of days.  Denies any pain around the eyes.  Denies any blurry vision out of her left eye.  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  Denies any change in vision out of this either.  She is also developed itchiness around her philtrum and on her nose.  Patient did recently come back from the beach.  Denies excessive sun exposure.  Patient denies any new lotions or facial locations or make-up.  Patient does use eyebrow pencil.  The rash is getting worse.  Denies any fevers, chills, facial pain, new joint pain, knee joint swelling.   Medication refills: Patient needs medications refilled including  ROS: Per HPI   Objective:  Physical Exam: BP 120/60   Pulse 76   Ht 5\' 1"  (1.549 m)   Wt 172 lb 8 oz (78.2 kg)   SpO2 97%   BMI 32.59 kg/m   Gen: NAD, resting comfortably CV: RRR with no murmurs appreciated Pulm: NWOB, CTAB with no crackles, wheezes, or rhonchi HEENT: There is erythema surrounding the orbits bilaterally with scaling, PERRLA, EOMI, face is nontender to palpation, the rash does extend to the philtrum, there is mild injected conjunctiva on the left eye Neuro: grossly normal, moves all extremities Psych: Normal affect and thought content  No results found for this or any previous visit (from the  past 72 hour(s)).   Assessment/Plan:  Rash of face Patient presenting with pruritic rash around the eyes and nose and philtrum.  It is scaly as well.  It is nontender, does not appear infected.  Appears to be some type of irritant contact dermatitis, cannot rule out fungal infection.  There are no discrete raised lesions is mostly confluent.  Patient also recently returned from the beach, possible that she had some unexpected sun exposure causing burn, although it is nonpainful.  Recommend trial of a low potency hydrocortisone to see if she has any improvement.  Patient follow-up in 1 week.  Patient is to be no ocular involvement.  Patient does have chronic allergic rhinoconjunctivitis.  I have refilled her medications including her eyedrops.  Patient follow-up if no improvement with this either.   Lab Orders  No laboratory test(s) ordered today    Meds ordered this encounter  Medications  . hydrocortisone 1 % ointment    Sig: Apply 1 application topically 2 (two) times daily.    Dispense:  30 g    Refill:  0  . flunisolide (NASALIDE) 25 MCG/ACT (0.025%) SOLN    Sig: Place 2 sprays into the nose 2 (two) times daily as needed.    Dispense:  25 mL    Refill:  1  . gabapentin (NEURONTIN) 800 MG tablet    Sig: Take 1 tablet (800 mg total) by mouth  4 (four) times daily.    Dispense:  120 tablet    Refill:  11  . olopatadine (PATANOL) 0.1 % ophthalmic solution    Sig: Place 1 drop into both eyes 2 (two) times daily.    Dispense:  5 mL    Refill:  Mason, MD, MS FAMILY MEDICINE RESIDENT - PGY3 12/13/2018 11:47 AM

## 2019-01-11 MED FILL — GABAPENTIN 800 MG TABS: 800 | 30 days supply | Qty: 120 | Fill #1

## 2019-01-11 MED FILL — OLOPATADINE HCL 0.1 % SOLN: 0.1 | 25 days supply | Qty: 5 | Fill #1

## 2019-01-19 DIAGNOSIS — H2513 Age-related nuclear cataract, bilateral: Secondary | ICD-10-CM | POA: Diagnosis not present

## 2019-01-19 DIAGNOSIS — H353211 Exudative age-related macular degeneration, right eye, with active choroidal neovascularization: Secondary | ICD-10-CM | POA: Diagnosis not present

## 2019-01-19 DIAGNOSIS — H3561 Retinal hemorrhage, right eye: Secondary | ICD-10-CM | POA: Diagnosis not present

## 2019-01-26 DIAGNOSIS — H2513 Age-related nuclear cataract, bilateral: Secondary | ICD-10-CM | POA: Diagnosis not present

## 2019-01-26 DIAGNOSIS — H353113 Nonexudative age-related macular degeneration, right eye, advanced atrophic without subfoveal involvement: Secondary | ICD-10-CM | POA: Diagnosis not present

## 2019-02-17 MED FILL — OLOPATADINE HCL 0.1 % SOLN: 0.1 | 25 days supply | Qty: 5 | Fill #2

## 2019-02-17 MED FILL — GABAPENTIN 800 MG TABS: 800 | 30 days supply | Qty: 120 | Fill #2

## 2019-02-17 MED FILL — FLUNISOLIDE 0.025% SPRAY: 25 MCG/ACT | 25 days supply | Qty: 25 | Fill #1

## 2019-02-19 ENCOUNTER — Ambulatory Visit: Payer: Medicare PPO | Attending: Internal Medicine

## 2019-02-19 DIAGNOSIS — Z23 Encounter for immunization: Secondary | ICD-10-CM | POA: Insufficient documentation

## 2019-02-19 NOTE — Progress Notes (Signed)
   Covid-19 Vaccination Clinic  Name:  Teresa Shaffer    MRN: [id number] DOB: 1940-09-03  02/19/2019  [name] was observed post Covid-19 immunization for 15 minutes without incidence. She was provided with Vaccine Information Sheet and instruction to access the V-Safe system.   [name] was instructed to call 911 with any severe reactions post vaccine: Marland Kitchen Difficulty breathing  . Swelling of your face and throat  . A fast heartbeat  . A bad rash all over your body  . Dizziness and weakness    Immunizations Administered    Name Date Dose VIS Date Route   Pfizer COVID-19 Vaccine 02/19/2019 10:25 AM 0.3 mL 12/16/2018 Intramuscular   Manufacturer: [place]   Lot: [id number]   [place]: [id number]

## 2019-03-14 ENCOUNTER — Ambulatory Visit: Payer: Medicare PPO | Attending: Internal Medicine

## 2019-03-14 DIAGNOSIS — Z23 Encounter for immunization: Secondary | ICD-10-CM | POA: Insufficient documentation

## 2019-03-14 NOTE — Progress Notes (Signed)
   Covid-19 Vaccination Clinic  Name:  Teresa Shaffer    MRN: [id number] DOB: 07-14-1940  03/14/2019  Teresa Shaffer was observed post Covid-19 immunization for 15 minutes without incident. She was provided with Vaccine Information Sheet and instruction to access the V-Safe system.   Teresa Shaffer was instructed to call 911 with any severe reactions post vaccine: Marland Kitchen Difficulty breathing  . Swelling of face and throat  . A fast heartbeat  . A bad rash all over body  . Dizziness and weakness   Immunizations Administered    Name Date Dose VIS Date Route   Pfizer COVID-19 Vaccine 03/14/2019  2:57 PM 0.3 mL 12/16/2018 Intramuscular   Manufacturer: [place]   Lot: [id number]   [place]: [id number]

## 2019-03-15 DIAGNOSIS — H353211 Exudative age-related macular degeneration, right eye, with active choroidal neovascularization: Secondary | ICD-10-CM | POA: Diagnosis not present

## 2019-03-15 DIAGNOSIS — H43811 Vitreous degeneration, right eye: Secondary | ICD-10-CM | POA: Diagnosis not present

## 2019-03-15 DIAGNOSIS — H2511 Age-related nuclear cataract, right eye: Secondary | ICD-10-CM | POA: Diagnosis not present

## 2019-03-15 DIAGNOSIS — H3561 Retinal hemorrhage, right eye: Secondary | ICD-10-CM | POA: Diagnosis not present

## 2019-05-02 ENCOUNTER — Other Ambulatory Visit: Payer: Self-pay | Admitting: Family Medicine

## 2019-05-02 MED FILL — GABAPENTIN 800 MG TABS: 800 | 30 days supply | Qty: 120 | Fill #3

## 2019-05-02 MED FILL — FLUNISOLIDE 0.025% SPRAY: 25 MCG/ACT | 25 days supply | Qty: 25 | Fill #0

## 2019-05-03 ENCOUNTER — Other Ambulatory Visit: Payer: Self-pay

## 2019-05-03 ENCOUNTER — Ambulatory Visit (INDEPENDENT_AMBULATORY_CARE_PROVIDER_SITE_OTHER): Payer: Medicare PPO | Admitting: Ophthalmology

## 2019-05-03 ENCOUNTER — Encounter: Payer: Self-pay | Admitting: Family Medicine

## 2019-05-03 DIAGNOSIS — H353132 Nonexudative age-related macular degeneration, bilateral, intermediate dry stage: Secondary | ICD-10-CM | POA: Diagnosis not present

## 2019-05-03 DIAGNOSIS — H353211 Exudative age-related macular degeneration, right eye, with active choroidal neovascularization: Secondary | ICD-10-CM

## 2019-05-03 DIAGNOSIS — H35362 Drusen (degenerative) of macula, left eye: Secondary | ICD-10-CM

## 2019-05-03 DIAGNOSIS — H2511 Age-related nuclear cataract, right eye: Secondary | ICD-10-CM

## 2019-05-03 DIAGNOSIS — H2512 Age-related nuclear cataract, left eye: Secondary | ICD-10-CM | POA: Diagnosis not present

## 2019-05-03 DIAGNOSIS — H3561 Retinal hemorrhage, right eye: Secondary | ICD-10-CM | POA: Diagnosis not present

## 2019-05-03 HISTORY — DX: Drusen (degenerative) of macula, left eye: H35.362

## 2019-05-03 HISTORY — DX: Age-related nuclear cataract, left eye: H25.12

## 2019-05-03 HISTORY — DX: Age-related nuclear cataract, right eye: H25.11

## 2019-05-03 HISTORY — DX: Retinal hemorrhage, right eye: H35.61

## 2019-05-03 HISTORY — DX: Nonexudative age-related macular degeneration, bilateral, intermediate dry stage: H35.3132

## 2019-05-03 MED ORDER — AFLIBERCEPT 2MG/0.05ML IZ SOLN FOR KALEIDOSCOPE
2.0000 mg | INTRAVITREAL | Status: AC | PRN
  Administered 2019-05-03: 2 mg via INTRAVITREAL

## 2019-05-03 NOTE — Assessment & Plan Note (Signed)
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  Forms of therapy reviewed include the use of Anti-VEGF medications injected painlessly into the eye, as well as other possible treatment modalities, including thermal laser therapy. Fellow eye involvement and risks were discussed with the patient. 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 The treatment regimen is on a treat as needed basis with the intent to treat if necessary and extend interval of exams when possible. On average 1 out of 6 patients do not need lifetime therapy. However, the risk of recurrent disease is high for a lifetime.  Initially monthly, then periodic, examinations and evaluations will determine whether the next treatment is required on the day of the examination.  oD stabilized.  Repeat eylea  today intravitreal in 8 weeks examination OD

## 2019-05-03 NOTE — Progress Notes (Signed)
05/03/2019     CHIEF COMPLAINT Patient presents for Retina Follow Up   HISTORY OF PRESENT ILLNESS: PARA ASTI is a 79 y.o. female who presents to the clinic today for:   HPI    Retina Follow Up    Patient presents with  Wet AMD.  In right eye.  Duration of 7 weeks.  Since onset it is stable.          Comments    7 week follow up- OCT OU, Possible Eylea OD Patient denies change in vision and overall has no complaints except for itching due to pollen.        Last edited by Gerda Diss on 05/03/2019  9:05 AM. (History)      Referring physician: McDiarmid, Blane Ohara, MD [place],  [place] [address]  HISTORICAL INFORMATION:   Selected notes from the MEDICAL RECORD NUMBER    Lab Results  Component Value Date   HGBA1C 6.0 (A) 08/05/2017     CURRENT MEDICATIONS: Current Outpatient Medications (Ophthalmic Drugs)  Medication Sig  . olopatadine (PATANOL) 0.1 % ophthalmic solution Place 1 drop into both eyes 2 (two) times daily.   No current facility-administered medications for this visit. (Ophthalmic Drugs)   Current Outpatient Medications (Other)  Medication Sig  . flunisolide (NASALIDE) 25 MCG/ACT (0.025%) SOLN PLACE 2 SPRAYS INTO EACH NOSTRIL TWO TIMES DAILY AS NEEDED  . Calcium Carbonate-Vitamin D (CALTRATE 600+D) 600-400 MG-UNIT per tablet Take 1 tablet by mouth 2 (two) times daily.    . cetirizine (ZYRTEC) 5 MG tablet Take 1 tablet (5 mg total) by mouth daily as needed for allergies.  Marland Kitchen gabapentin (NEURONTIN) 800 MG tablet Take 1 tablet (800 mg total) by mouth 4 (four) times daily.  . hydrocortisone 1 % ointment Apply 1 application topically 2 (two) times daily.  . magnesium oxide (MAG-OX) 400 MG tablet Take 400 mg by mouth daily.  . Multiple Vitamin (MULTIVITAMINS PO) Take one tablet daily.   . Multiple Vitamins-Minerals (PRESERVISION AREDS) TABS Take by mouth.  . naproxen sodium (ALEVE) 220 MG tablet Take 1 tablet (220 mg total) by mouth  2 (two) times daily as needed.   No current facility-administered medications for this visit. (Other)      REVIEW OF SYSTEMS:    ALLERGIES No Known Allergies  PAST MEDICAL HISTORY Past Medical History:  Diagnosis Date  . 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 Cerumen impaction 09/02/2012  . Chest pain 04/24/2013  . Complete rotator cuff tear 02/16/2013   On scan today she appears to have the anterior and superior capsule torn away with retraction   . DNR (do not resuscitate) 12/18/2014   Mrs Harkirat Dobias has an authorized Transport DNR (Yellow DNR) document dated 12/06/14  . FIBROADENOSIS, BREAST 03/04/2006   Qualifier: Diagnosis of  By: Eusebio Friendly    . Fibrocystic breast changes, bilateral 08/28/2016   Has had cyst aspiration in past  . Frozen shoulder 10/25/2014  . 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 Minimal DJD changes on xray. Sunrise view wasn't done.    Marland Kitchen LBP (low back pain) 07/26/2013  . 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 Pain of cervical spine 11/10/2011   left mid trapezius   . 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 Right shoulder pain 07/26/2013  . Seasonal allergic rhinitis 10/25/2014  . Sinusitis, acute 12/26/2012  . Unsteady gait 09/02/2012   Past Surgical History:  Procedure Laterality Date  . BREAST CYST ASPIRATION  1999   benign fibrocystic disease  . COLONOSCOPY  09/2014   No polyps, Dr Cleaster Corin.   . COLONOSCOPY W/ BIOPSIES AND POLYPECTOMY  12/2003   tubular adenoma  . ENDOMETRIAL BIOPSY     benign  . ROTATOR CUFF REPAIR     Dr Mardelle Matte (Ortho)  . TONSILLECTOMY  1953  . TYMPANOPLASTY WITH GRAFT Left 11/17/2016    Surgeon: Vicie Mutters MD (ENT)     FAMILY HISTORY Family History  Problem Relation Age of Onset  . Neuropathy Father   . Diabetes Father   . Coronary artery disease Father   . Neuropathy Brother   . Neuropathy Brother   . Diabetes Brother     SOCIAL HISTORY Social History   Tobacco Use  . Smoking status: Never Smoker  . Smokeless tobacco: Never Used  Substance Use Topics  . Alcohol use: Yes    Alcohol/week: 0.0 standard drinks    Comment: rare alcohol  . Drug use: No         OPHTHALMIC EXAM: Base Eye Exam    Visual Acuity (Snellen - Linear)      Right Left   Dist cc 20/400 20/25-2   Dist ph cc NI        Tonometry (Tonopen, 9:15 AM)      Right Left   Pressure 13 13       Pupils      Pupils Dark Light Shape React APD   Right PERRL 3 2 Round Slow None   Left PERRL 3 2 Round Slow None       Visual Fields (Counting fingers)      Left Right    Full Full       Extraocular Movement      Right Left    Full Full       Neuro/Psych    Oriented x3: Yes   Mood/Affect: Normal       Dilation    Both eyes: 1.0% Mydriacyl, 2.5% Phenylephrine @ 9:15 AM        Slit Lamp and Fundus Exam    External Exam      Right Left   External Normal Normal       Slit Lamp Exam      Right Left   Lids/Lashes Normal Normal   Conjunctiva/Sclera White and quiet White and quiet   Cornea Clear Clear   Anterior Chamber Deep and quiet Deep and quiet   Iris Round and reactive Round and reactive   Vitreous Normal Normal          IMAGING AND PROCEDURES  Imaging and Procedures for 05/03/19           ASSESSMENT/PLAN:  No problem-specific Assessment & Plan notes found for this encounter.      ICD-10-CM   1. Exudative age-related macular degeneration of right eye with active choroidal neovascularization ([place])  H35.3211   2. Retinal hemorrhage of right eye  H35.61   3. Degenerative retinal drusen of left eye  H35.362   4. Nuclear sclerotic cataract of right eye   H25.11   5. Nuclear sclerotic  cataract of left eye  H25.12     1.  Eylea injection OD intravitreal today  2.  OS, use vitamins as suggested  3.  Ophthalmic Meds Ordered this visit:  No orders of the defined types were placed in this encounter.      No follow-ups on file.  There are no Patient Instructions on file for this visit.   Explained the diagnoses, plan, and follow up with the patient and they expressed understanding.  Patient expressed understanding of the importance of proper follow up care.   Clent Demark Brynne Doane M.D. Diseases & Surgery of the Retina and Vitreous Retina & Diabetic [place] 05/03/19     Abbreviations: M myopia (nearsighted); A astigmatism; H hyperopia (farsighted); P presbyopia; Mrx spectacle prescription;  CTL contact lenses; OD right eye; OS left eye; OU both eyes  XT exotropia; ET esotropia; PEK punctate epithelial keratitis; PEE punctate epithelial erosions; DES dry eye syndrome; MGD meibomian gland dysfunction; ATs artificial tears; PFAT's preservative free artificial tears; [place] nuclear sclerotic cataract; PSC posterior subcapsular cataract; ERM epi-retinal membrane; PVD posterior vitreous detachment; RD retinal detachment; DM diabetes mellitus; DR diabetic retinopathy; NPDR non-proliferative diabetic retinopathy; PDR proliferative diabetic retinopathy; CSME clinically significant macular edema; DME diabetic macular edema; dbh dot blot hemorrhages; CWS cotton wool spot; POAG primary open angle glaucoma; C/D cup-to-disc ratio; HVF humphrey visual field; GVF goldmann visual field; OCT optical coherence tomography; IOP intraocular pressure; BRVO Branch retinal vein occlusion; CRVO central retinal vein occlusion; CRAO central retinal artery occlusion; BRAO branch retinal artery occlusion; RT retinal tear; SB scleral buckle; PPV pars plana vitrectomy; VH Vitreous hemorrhage; PRP panretinal laser photocoagulation; IVK intravitreal kenalog; VMT vitreomacular  traction; MH Macular hole;  NVD neovascularization of the disc; NVE neovascularization elsewhere; AREDS age related eye disease study; ARMD age related macular degeneration; POAG primary open angle glaucoma; EBMD epithelial/anterior basement membrane dystrophy; ACIOL anterior chamber intraocular lens; IOL intraocular lens; PCIOL posterior chamber intraocular lens; Phaco/IOL phacoemulsification with intraocular lens placement; [place] photorefractive keratectomy; LASIK laser assisted in situ keratomileusis; HTN hypertension; DM diabetes mellitus; COPD chronic obstructive pulmonary disease

## 2019-05-03 NOTE — Assessment & Plan Note (Signed)

## 2019-06-28 ENCOUNTER — Ambulatory Visit (INDEPENDENT_AMBULATORY_CARE_PROVIDER_SITE_OTHER): Payer: Medicare PPO | Admitting: Ophthalmology

## 2019-06-28 ENCOUNTER — Other Ambulatory Visit: Payer: Self-pay

## 2019-06-28 DIAGNOSIS — H353211 Exudative age-related macular degeneration, right eye, with active choroidal neovascularization: Secondary | ICD-10-CM | POA: Diagnosis not present

## 2019-06-28 MED ORDER — AFLIBERCEPT 2MG/0.05ML IZ SOLN FOR KALEIDOSCOPE
2.0000 mg | INTRAVITREAL | Status: AC | PRN
  Administered 2019-06-28: 2 mg via INTRAVITREAL

## 2019-06-28 NOTE — Progress Notes (Signed)
06/28/2019     CHIEF COMPLAINT Patient presents for Retina Follow Up   HISTORY OF PRESENT ILLNESS: Teresa Shaffer is a 79 y.o. female who presents to the clinic today for:   HPI    Retina Follow Up    Patient presents with  Wet AMD.  In right eye.  Duration of 8 weeks.  Since onset it is stable.          Comments    8 week f.u -  OCT OU, Poss Eylea OD Patient denies change in vision and overall has no complaints.        Last edited by Gerda Diss on 06/28/2019  9:16 AM. (History)      Referring physician: McDiarmid, Blane Ohara, MD [place],  [place] [address]  HISTORICAL INFORMATION:   Selected notes from the MEDICAL RECORD NUMBER    Lab Results  Component Value Date   HGBA1C 6.0 (A) 08/05/2017     CURRENT MEDICATIONS: Current Outpatient Medications (Ophthalmic Drugs)  Medication Sig  . olopatadine (PATANOL) 0.1 % ophthalmic solution Place 1 drop into both eyes 2 (two) times daily.   No current facility-administered medications for this visit. (Ophthalmic Drugs)   Current Outpatient Medications (Other)  Medication Sig  . flunisolide (NASALIDE) 25 MCG/ACT (0.025%) SOLN PLACE 2 SPRAYS INTO EACH NOSTRIL TWO TIMES DAILY AS NEEDED  . Calcium Carbonate-Vitamin D (CALTRATE 600+D) 600-400 MG-UNIT per tablet Take 1 tablet by mouth 2 (two) times daily.    . cetirizine (ZYRTEC) 5 MG tablet Take 1 tablet (5 mg total) by mouth daily as needed for allergies.  Marland Kitchen gabapentin (NEURONTIN) 800 MG tablet Take 1 tablet (800 mg total) by mouth 4 (four) times daily.  . hydrocortisone 1 % ointment Apply 1 application topically 2 (two) times daily.  . magnesium oxide (MAG-OX) 400 MG tablet Take 400 mg by mouth daily.  . Multiple Vitamin (MULTIVITAMINS PO) Take one tablet daily.   . Multiple Vitamins-Minerals (PRESERVISION AREDS) TABS Take by mouth.  . naproxen sodium (ALEVE) 220 MG tablet Take 1 tablet (220 mg total) by mouth 2 (two) times daily as needed.   No  current facility-administered medications for this visit. (Other)      REVIEW OF SYSTEMS:    ALLERGIES No Known Allergies  PAST MEDICAL HISTORY Past Medical History:  Diagnosis Date  . 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 Cerumen impaction 09/02/2012  . Chest pain 04/24/2013  . Complete rotator cuff tear 02/16/2013   On scan today she appears to have the anterior and superior capsule torn away with retraction   . DNR (do not resuscitate) 12/18/2014   [name] has an authorized Transport DNR (Yellow DNR) document dated 12/06/14  . FIBROADENOSIS, BREAST 03/04/2006   Qualifier: Diagnosis of  By: Eusebio Friendly    . Fibrocystic breast changes, bilateral 08/28/2016   Has had cyst aspiration in past  . Frozen shoulder 10/25/2014  . 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 Minimal DJD changes on xray. Sunrise view wasn't done.    Marland Kitchen LBP (low back pain) 07/26/2013  . 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 Lip licking dermatitis 9/89/2119  . Macular degeneration  08/28/2016  . Pain of cervical spine 11/10/2011   left mid trapezius   . 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 Right shoulder pain 07/26/2013  . Seasonal allergic rhinitis 10/25/2014  . Sinusitis, acute 12/26/2012  . Unsteady gait 09/02/2012   Past Surgical History:  Procedure Laterality Date  . BREAST CYST ASPIRATION  1999   benign fibrocystic disease  . COLONOSCOPY  09/2014   No polyps, Dr Cleaster Corin.   . COLONOSCOPY W/ BIOPSIES AND POLYPECTOMY  12/2003   tubular adenoma  . ENDOMETRIAL BIOPSY     benign  . ROTATOR CUFF REPAIR     Dr Mardelle Matte (Ortho)  . TONSILLECTOMY  1953  . TYMPANOPLASTY WITH GRAFT Left 11/17/2016    Surgeon: Vicie Mutters MD (ENT)     FAMILY HISTORY Family History  Problem Relation Age of Onset  . Neuropathy Father   . Diabetes Father   . Coronary artery disease Father   . Neuropathy Brother   . Neuropathy Brother   . Diabetes Brother     SOCIAL HISTORY Social History   Tobacco Use  . Smoking status: Never Smoker  . Smokeless tobacco: Never Used  Substance Use Topics  . Alcohol use: Yes    Alcohol/week: 0.0 standard drinks    Comment: rare alcohol  . Drug use: No         OPHTHALMIC EXAM:  Base Eye Exam    Visual Acuity (Snellen - Linear)      Right Left   Dist cc 20/400 20/40+2   Dist ph cc NI NI   Correction: Glasses       Tonometry (Tonopen, 9:23 AM)      Right Left   Pressure 12 13       Pupils      Pupils Dark Light Shape React APD   Right PERRL 3 2 Round Slow None   Left PERRL 3 2 Round Slow None       Visual Fields (Counting fingers)      Left Right    Full Full       Extraocular Movement      Right Left    Full Full       Neuro/Psych    Oriented x3: Yes   Mood/Affect: Normal       Dilation    Right eye: 1.0% Mydriacyl, 2.5% Phenylephrine @ 9:23 AM        Slit Lamp and Fundus Exam    External Exam      Right Left   External Normal Normal       Slit Lamp Exam      Right Left   Lids/Lashes Normal Normal   Conjunctiva/Sclera White and quiet White and quiet   Cornea Clear Clear   Anterior Chamber Deep and quiet Deep and quiet   Iris Round and reactive Round and reactive   Lens 2+ Nuclear sclerosis 2+ Nuclear sclerosis   Anterior Vitreous Normal Normal       Fundus Exam      Right Left   Posterior Vitreous Normal    Disc Normal    C/D Ratio 0.25    Macula Disciform scar, Hard drusen, no exudates, Macular thickening, no hemorrhage, Retinal pigment epithelial mottling, Advanced age related macular degeneration, Choroidal nevus, Geographic atrophy    Vessels Normal    Periphery Normal           IMAGING AND PROCEDURES   Imaging and Procedures  for 06/28/19  OCT, Retina - OU - Both Eyes       Right Eye Quality was good. Scan locations included subfoveal. Central Foveal Thickness: 298. Progression has improved. Findings include subretinal hyper-reflective material, abnormal foveal contour, disciform scar.   Left Eye Quality was good. Scan locations included subfoveal. Central Foveal Thickness: 265. Progression has been stable. Findings include subretinal hyper-reflective material, retinal drusen , no SRF, no IRF.   Notes OD vastly improved since onset of findings of subfoveal CNVM and therapy December 2019.  No sign of active lesion growth at 8-week examination.  Will repeat Eylea OD today and examination in 10 weeks       Intravitreal Injection, Pharmacologic Agent - OD - Right Eye       Time Out 06/28/2019. 10:36 AM. Confirmed correct patient, procedure, site, and patient consented.   Anesthesia Topical anesthesia was used. Anesthetic medications included [place] 3.5%.   Procedure Preparation included Tobramycin 0.3%, 10% betadine to eyelids. A 30 gauge needle was used.   Injection:  2 mg aflibercept Alfonse Flavors) SOLN   NDC: [id number], Lot: [id number]   Route: Intravitreal, Site: Right Eye, Waste: 0 mg  Post-op Post injection exam found visual acuity of at least counting fingers. The patient tolerated the procedure well. There were no complications. The patient received written and verbal post procedure care education. Post injection medications were not given.                 ASSESSMENT/PLAN:  Exudative age-related macular degeneration of right eye with active choroidal neovascularization (HCC) The nature of wet macular degeneration was discussed with the patient.  Forms of therapy reviewed include the use of Anti-VEGF medications injected painlessly into the eye, as well as other possible treatment modalities, including thermal laser therapy. Fellow eye involvement and risks were  discussed with the patient. 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 The treatment regimen is on a treat as needed basis with the intent to treat if necessary and extend interval of exams when possible. On average 1 out of 6 patients do not need lifetime therapy. However, the risk of recurrent disease is high for a lifetime.  Initially monthly, then periodic, examinations and evaluations will determine whether the next treatment is required on the day of the examination.  OD, vastly improved since December 2019.  No active lesion components today at 8-week examination.  Visual acuity is limited by subfoveal fibrotic disciform scar seen on OCT and clinical examination.  We will repeat Eylea injection OD today and examination in 10 weeks      ICD-10-CM   1. Exudative age-related macular degeneration of right eye with active choroidal neovascularization (HCC)  H35.3211 OCT, Retina - OU - Both Eyes    Intravitreal Injection, Pharmacologic Agent - OD - Right Eye    aflibercept (EYLEA) SOLN 2 mg    1.  2.  3.  Ophthalmic Meds Ordered this visit:  Meds ordered this encounter  Medications  . aflibercept (EYLEA) SOLN 2 mg       Return in about 10 weeks (around 09/06/2019) for DILATE OU, EYLEA OCT, OD.  There are no Patient Instructions on file for this visit.   Explained the diagnoses, plan, and follow up with the patient and they expressed understanding.  Patient expressed understanding of the importance of proper follow up care.   Clent Demark Katryn Plummer M.D. Diseases & Surgery of the Retina and Vitreous Retina & Diabetic [place] 06/28/19  Abbreviations: M myopia (nearsighted); A astigmatism; H hyperopia (farsighted); P presbyopia; Mrx spectacle prescription;  CTL contact lenses; OD right eye; OS left eye; OU both eyes  XT exotropia; ET esotropia; PEK punctate epithelial keratitis; PEE punctate epithelial erosions; DES dry eye syndrome; MGD  meibomian gland dysfunction; ATs artificial tears; PFAT's preservative free artificial tears; [place] nuclear sclerotic cataract; PSC posterior subcapsular cataract; ERM epi-retinal membrane; PVD posterior vitreous detachment; RD retinal detachment; DM diabetes mellitus; DR diabetic retinopathy; NPDR non-proliferative diabetic retinopathy; PDR proliferative diabetic retinopathy; CSME clinically significant macular edema; DME diabetic macular edema; dbh dot blot hemorrhages; CWS cotton wool spot; POAG primary open angle glaucoma; C/D cup-to-disc ratio; HVF humphrey visual field; GVF goldmann visual field; OCT optical coherence tomography; IOP intraocular pressure; BRVO Branch retinal vein occlusion; CRVO central retinal vein occlusion; CRAO central retinal artery occlusion; BRAO branch retinal artery occlusion; RT retinal tear; SB scleral buckle; PPV pars plana vitrectomy; VH Vitreous hemorrhage; PRP panretinal laser photocoagulation; IVK intravitreal kenalog; VMT vitreomacular traction; MH Macular hole;  NVD neovascularization of the disc; NVE neovascularization elsewhere; AREDS age related eye disease study; ARMD age related macular degeneration; POAG primary open angle glaucoma; EBMD epithelial/anterior basement membrane dystrophy; ACIOL anterior chamber intraocular lens; IOL intraocular lens; PCIOL posterior chamber intraocular lens; Phaco/IOL phacoemulsification with intraocular lens placement; [place] photorefractive keratectomy; LASIK laser assisted in situ keratomileusis; HTN hypertension; DM diabetes mellitus; COPD chronic obstructive pulmonary disease

## 2019-06-28 NOTE — Assessment & Plan Note (Signed)
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  Forms of therapy reviewed include the use of Anti-VEGF medications injected painlessly into the eye, as well as other possible treatment modalities, including thermal laser therapy. Fellow eye involvement and risks were discussed with the patient. 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 The treatment regimen is on a treat as needed basis with the intent to treat if necessary and extend interval of exams when possible. On average 1 out of 6 patients do not need lifetime therapy. However, the risk of recurrent disease is high for a lifetime.  Initially monthly, then periodic, examinations and evaluations will determine whether the next treatment is required on the day of the examination.  OD, vastly improved since December 2019.  No active lesion components today at 8-week examination.  Visual acuity is limited by subfoveal fibrotic disciform scar seen on OCT and clinical examination.  We will repeat Eylea injection OD today and examination in 10 weeks

## 2019-07-27 DIAGNOSIS — H5202 Hypermetropia, left eye: Secondary | ICD-10-CM | POA: Diagnosis not present

## 2019-07-27 DIAGNOSIS — H2513 Age-related nuclear cataract, bilateral: Secondary | ICD-10-CM | POA: Diagnosis not present

## 2019-07-27 DIAGNOSIS — H353113 Nonexudative age-related macular degeneration, right eye, advanced atrophic without subfoveal involvement: Secondary | ICD-10-CM | POA: Diagnosis not present

## 2019-08-16 MED FILL — GABAPENTIN 800 MG TABS: 800 | 30 days supply | Qty: 120 | Fill #5

## 2019-08-16 MED FILL — FLUNISOLIDE 0.025% SPRAY: 25 MCG/ACT | 25 days supply | Qty: 25 | Fill #2

## 2019-09-06 ENCOUNTER — Encounter (INDEPENDENT_AMBULATORY_CARE_PROVIDER_SITE_OTHER): Payer: Medicare PPO | Admitting: Ophthalmology

## 2019-09-07 ENCOUNTER — Ambulatory Visit (INDEPENDENT_AMBULATORY_CARE_PROVIDER_SITE_OTHER): Payer: Medicare PPO | Admitting: Family Medicine

## 2019-09-07 ENCOUNTER — Encounter: Payer: Self-pay | Admitting: Family Medicine

## 2019-09-07 ENCOUNTER — Other Ambulatory Visit: Payer: Self-pay

## 2019-09-07 VITALS — BP 104/60 | HR 78 | Ht 61.0 in | Wt 173.0 lb

## 2019-09-07 DIAGNOSIS — K635 Polyp of colon: Secondary | ICD-10-CM

## 2019-09-07 DIAGNOSIS — Z9189 Other specified personal risk factors, not elsewhere classified: Secondary | ICD-10-CM | POA: Diagnosis not present

## 2019-09-07 DIAGNOSIS — Z6832 Body mass index (BMI) 32.0-32.9, adult: Secondary | ICD-10-CM

## 2019-09-07 DIAGNOSIS — R7401 Elevation of levels of liver transaminase levels: Secondary | ICD-10-CM

## 2019-09-07 DIAGNOSIS — Z1159 Encounter for screening for other viral diseases: Secondary | ICD-10-CM | POA: Diagnosis not present

## 2019-09-07 DIAGNOSIS — K921 Melena: Secondary | ICD-10-CM

## 2019-09-07 DIAGNOSIS — J302 Other seasonal allergic rhinitis: Secondary | ICD-10-CM

## 2019-09-07 DIAGNOSIS — Z13220 Encounter for screening for lipoid disorders: Secondary | ICD-10-CM | POA: Diagnosis not present

## 2019-09-07 DIAGNOSIS — E669 Obesity, unspecified: Secondary | ICD-10-CM

## 2019-09-07 DIAGNOSIS — G609 Hereditary and idiopathic neuropathy, unspecified: Secondary | ICD-10-CM

## 2019-09-07 DIAGNOSIS — Z23 Encounter for immunization: Secondary | ICD-10-CM | POA: Diagnosis not present

## 2019-09-07 DIAGNOSIS — Z79899 Other long term (current) drug therapy: Secondary | ICD-10-CM

## 2019-09-07 LAB — POCT GLYCOSYLATED HEMOGLOBIN (HGB A1C): HbA1c, POC (controlled diabetic range): 6.3 % (ref 0.0–7.0)

## 2019-09-07 NOTE — Patient Instructions (Signed)
Rectal Bleeding  Rectal bleeding is when blood passes out of the anus. People with rectal bleeding may notice bright red blood in their underwear or in the toilet after having a bowel movement. They may also have dark red or black stools. Rectal bleeding is usually a sign that something is wrong. Many things can cause rectal bleeding, including:  Hemorrhoids. These are blood vessels in the anus or rectum that are larger than normal.  Fistulas. These are abnormal passages in the rectum and anus.  Anal fissures. This is a tear in the anus.  Diverticulosis. This is a condition in which pockets or sacs project from the bowel.  Proctitis and colitis. These are conditions in which the rectum, colon, or anus become inflamed.  Polyps. These are growths that can be cancerous (malignant) or non-cancerous (benign).  Part of the rectum sticking out from the anus (rectal prolapse).  Certain medicines.  Intestinal infections. Follow these instructions at home: Pay attention to any changes in your symptoms. Take these actions to help lessen bleeding and discomfort:  Eat a diet that is high in fiber. This will keep your stool soft, making it easier to pass stools without straining. Ask your health care provider what foods and drinks are high in fiber.  Drink enough fluid to keep your urine clear or pale yellow. This also helps to keep your stool soft.  Try taking a warm bath. This may help soothe any pain in your rectum.  Keep all follow-up visits as told by your health care provider. This is important. Get help right away if:  You have new or increased rectal bleeding.  You have black or dark red stools.  You vomit blood or something that looks like coffee grounds.  You have pain or tenderness in your abdomen.  You have a fever.  You feel weak.  You feel nauseous.  You faint.  You have severe pain in your rectum.  You cannot have a bowel movement. This information is not  intended to replace advice given to you by your health care provider. Make sure you discuss any questions you have with your health care provider. Document Revised: 08/15/2015 Document Reviewed: 02/17/2015 Elsevier Patient Education  2020 [employer] Inc.  

## 2019-09-08 ENCOUNTER — Encounter: Payer: Self-pay | Admitting: Family Medicine

## 2019-09-08 LAB — CMP14+EGFR
ALT: 27 IU/L (ref 0–32)
AST: 26 IU/L (ref 0–40)
Albumin/Globulin Ratio: 1.5 (ref 1.2–2.2)
Albumin: 4 g/dL (ref 3.7–4.7)
Alkaline Phosphatase: 108 IU/L (ref 48–121)
BUN/Creatinine Ratio: 32 — ABNORMAL HIGH (ref 12–28)
BUN: 20 mg/dL (ref 8–27)
Bilirubin Total: 0.3 mg/dL (ref 0.0–1.2)
CO2: 25 mmol/L (ref 20–29)
Calcium: 9.5 mg/dL (ref 8.7–10.3)
Chloride: 100 mmol/L (ref 96–106)
Creatinine, Ser: 0.63 mg/dL (ref 0.57–1.00)
GFR calc Af Amer: 99 mL/min/{1.73_m2} (ref >59)
GFR calc non Af Amer: 86 mL/min/{1.73_m2} (ref >59)
Globulin, Total: 2.7 g/dL (ref 1.5–4.5)
Glucose: 178 mg/dL — ABNORMAL HIGH (ref 65–99)
Potassium: 4.5 mmol/L (ref 3.5–5.2)
Sodium: 141 mmol/L (ref 134–144)
Total Protein: 6.7 g/dL (ref 6.0–8.5)

## 2019-09-08 LAB — CBC
Hematocrit: 41.3 % (ref 34.0–46.6)
Hemoglobin: 13.4 g/dL (ref 11.1–15.9)
MCH: 29.6 pg (ref 26.6–33.0)
MCHC: 32.4 g/dL (ref 31.5–35.7)
MCV: 91 fL (ref 79–97)
Platelets: 263 10*3/uL (ref 150–450)
RBC: 4.52 x10E6/uL (ref 3.77–5.28)
RDW: 13 % (ref 11.7–15.4)
WBC: 8.4 10*3/uL (ref 3.4–10.8)

## 2019-09-08 LAB — LIPID PANEL
Chol/HDL Ratio: 5.4 ratio — ABNORMAL HIGH (ref 0.0–4.4)
Cholesterol, Total: 184 mg/dL (ref 100–199)
HDL: 34 mg/dL — ABNORMAL LOW (ref >39)
LDL Chol Calc (NIH): 87 mg/dL (ref 0–99)
Triglycerides: 383 mg/dL — ABNORMAL HIGH (ref 0–149)
VLDL Cholesterol Cal: 63 mg/dL — ABNORMAL HIGH (ref 5–40)

## 2019-09-08 LAB — HEPATITIS C ANTIBODY: Hep C Virus Ab: 0.1 s/co ratio (ref 0.0–0.9)

## 2019-09-08 NOTE — Assessment & Plan Note (Signed)
Established problem. Stable. Continue current medications and other regiments.  

## 2019-09-08 NOTE — Assessment & Plan Note (Signed)
Wt Readings from Last 3 Encounters:  09/07/19 173 lb (78.5 kg)  12/13/18 172 lb 8 oz (78.2 kg)  02/07/18 169 lb 12.8 oz (77 kg)  Established problem Uncontrolled given at risk for diabetes. See Prediabetes (at-risk diabetes px above)

## 2019-09-08 NOTE — Assessment & Plan Note (Signed)
Established problem worsened.  A1c 6.3%.   Wt Readings from Last 3 Encounters:  09/07/19 173 lb (78.5 kg)  12/13/18 172 lb 8 oz (78.2 kg)  02/07/18 169 lb 12.8 oz (77 kg)   Lab Results  Component Value Date   CHOL 184 09/07/2019   HDL 34 (L) 09/07/2019   LDLCALC 87 09/07/2019   LDLDIRECT 111 (H) 04/26/2007   TRIG 383 (H) 09/07/2019   CHOLHDL 5.4 (H) 09/07/2019   Recommend 150 minutes moderate exercise per week with reducing concentrated sugars in diet.  Goal 5-7% weight reduction.  Monitor A1c periodically

## 2019-09-08 NOTE — Progress Notes (Signed)
Teresa Shaffer is accompanied by spouse Sources of clinical information for visit is/are patient and past medical records. Nursing assessment for this office visit was reviewed with the patient for accuracy and revision.     Previous Report(s) Reviewed: lab reports and office notes  Depression screen [facility] 2/9 09/07/2019  Decreased Interest 0  Down, Depressed, Hopeless 0  PHQ - 2 Score 0  Altered sleeping 0  Tired, decreased energy 1  Change in appetite 1  Feeling bad or failure about yourself  0  Trouble concentrating 0  Moving slowly or fidgety/restless 0  Suicidal thoughts 0  PHQ-9 Score 2    Fall Risk  09/07/2019 12/13/2018 12/08/2017 09/30/2017 08/05/2017  Falls in the past year? 0 0 0 No No  Number falls in past yr: 0 - - - -  Injury with Fall? 0 - - - -  Follow up Falls evaluation completed - - - -    PHQ9 SCORE ONLY 09/07/2019 12/13/2018 12/08/2017  PHQ-9 Total Score 2 0 0    Adult vaccines due  Topic Date Due  . TETANUS/TDAP  03/03/2022    There are no preventive care reminders to display for this patient.    History/P.E. limitations: none  Adult vaccines due  Topic Date Due  . TETANUS/TDAP  03/03/2022   There are no preventive care reminders to display for this patient. There are no preventive care reminders to display for this patient.   Chief Complaint  Patient presents with  . Annual Exam  . blood in stool x 1.5 weeks    CPT E&M Office Visit Time  Total Visit Time: 39 minutes  Visit Problem List with A/P  Seasonal allergic rhinitis Established problem. Stable. Continue current medications and other regiments.   idiopathic peripheral neuropathy Established problem. Stable. Continue current medications and other regiments.   At risk for diabetes mellitus Established problem worsened.  A1c 6.3%.   Wt Readings from Last 3 Encounters:  09/07/19 173 lb (78.5 kg)  12/13/18 172 lb 8 oz (78.2 kg)  02/07/18 169 lb 12.8 oz (77 kg)   Lab Results   Component Value Date   CHOL 184 09/07/2019   HDL 34 (L) 09/07/2019   LDLCALC 87 09/07/2019   LDLDIRECT 111 (H) 04/26/2007   TRIG 383 (H) 09/07/2019   CHOLHDL 5.4 (H) 09/07/2019   Recommend 150 minutes moderate exercise per week with reducing concentrated sugars in diet.  Goal 5-7% weight reduction.  Monitor A1c periodically    OBESITY, NOS Wt Readings from Last 3 Encounters:  09/07/19 173 lb (78.5 kg)  12/13/18 172 lb 8 oz (78.2 kg)  02/07/18 169 lb 12.8 oz (77 kg)  Established problem Uncontrolled given at risk for diabetes. See Prediabetes (at-risk diabetes px above)

## 2019-09-12 MED FILL — FLUNISOLIDE 0.025% SPRAY: 25 MCG/ACT | 25 days supply | Qty: 25 | Fill #3

## 2019-09-12 MED FILL — GABAPENTIN 800 MG TABS: 800 | 30 days supply | Qty: 120 | Fill #6

## 2019-09-27 ENCOUNTER — Encounter (INDEPENDENT_AMBULATORY_CARE_PROVIDER_SITE_OTHER): Payer: Self-pay | Admitting: Ophthalmology

## 2019-09-27 ENCOUNTER — Other Ambulatory Visit: Payer: Self-pay

## 2019-09-27 ENCOUNTER — Ambulatory Visit (INDEPENDENT_AMBULATORY_CARE_PROVIDER_SITE_OTHER): Payer: Medicare PPO | Admitting: Ophthalmology

## 2019-09-27 DIAGNOSIS — H2511 Age-related nuclear cataract, right eye: Secondary | ICD-10-CM

## 2019-09-27 DIAGNOSIS — H353211 Exudative age-related macular degeneration, right eye, with active choroidal neovascularization: Secondary | ICD-10-CM

## 2019-09-27 MED ORDER — AFLIBERCEPT 2MG/0.05ML IZ SOLN FOR KALEIDOSCOPE
2.0000 mg | INTRAVITREAL | Status: AC | PRN
  Administered 2019-09-27: 2 mg via INTRAVITREAL

## 2019-09-27 NOTE — Patient Instructions (Signed)
Patient asked to report any new onset visual acuity decline or changes in either eye

## 2019-09-27 NOTE — Assessment & Plan Note (Signed)
Patient may proceed with cataract extraction in either eye as indicated by her surgeon.  I did explain that I would suggest cataract surgery in the right eye simply to balance the eyes but also teach the patient and her surgeon how the eye reacts to cataract surgery undertaking the left eye process

## 2019-09-27 NOTE — Progress Notes (Signed)
09/27/2019     CHIEF COMPLAINT Patient presents for Retina Follow Up   HISTORY OF PRESENT ILLNESS: Teresa Shaffer is a 79 y.o. female who presents to the clinic today for:   HPI    Retina Follow Up    Patient presents with  Wet AMD.  In right eye.  Severity is moderate.  Duration of 13 weeks.  Since onset it is stable.  I, the attending physician,  performed the HPI with the patient and updated documentation appropriately.          Comments    13 Week AMD f\u OU. Possible Eylea OD. OCT  Pt states OD vision is slowly decreasing over time. Denies any other complaints        Last edited by Tilda Franco on 09/27/2019  9:34 AM. (History)      Referring physician: McDiarmid, Blane Ohara, MD [place],  [place] [address]  HISTORICAL INFORMATION:   Selected notes from the MEDICAL RECORD NUMBER    Lab Results  Component Value Date   HGBA1C 6.3 09/07/2019     CURRENT MEDICATIONS: Current Outpatient Medications (Ophthalmic Drugs)  Medication Sig  . Aflibercept (EYLEA) 2 MG/0.05ML SOLN by Intravitreal route.   No current facility-administered medications for this visit. (Ophthalmic Drugs)   Current Outpatient Medications (Other)  Medication Sig  . Calcium Carbonate-Vitamin D (CALTRATE 600+D) 600-400 MG-UNIT per tablet Take 1 tablet by mouth 2 (two) times daily.    . cetirizine (ZYRTEC) 5 MG tablet Take 1 tablet (5 mg total) by mouth daily as needed for allergies.  . flunisolide (NASALIDE) 25 MCG/ACT (0.025%) SOLN PLACE 2 SPRAYS INTO EACH NOSTRIL TWO TIMES DAILY AS NEEDED  . gabapentin (NEURONTIN) 800 MG tablet Take 1 tablet (800 mg total) by mouth 4 (four) times daily.  . magnesium oxide (MAG-OX) 400 MG tablet Take 400 mg by mouth daily.  . Multiple Vitamin (MULTIVITAMINS PO) Take one tablet daily.   . Multiple Vitamins-Minerals (PRESERVISION AREDS) TABS Take by mouth.   No current facility-administered medications for this visit. (Other)       REVIEW OF SYSTEMS:    ALLERGIES No Known Allergies  PAST MEDICAL HISTORY Past Medical History:  Diagnosis Date  . 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 Allergic reaction 02/07/2018  . Allergic rhinoconjunctivitis 08/05/2017  . Cerumen impaction 09/02/2012  . Chest pain 04/24/2013  . Complete rotator cuff tear 02/16/2013   On scan today she appears to have the anterior and superior capsule torn away with retraction   . 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 Minimal DJD changes on xray. Sunrise view wasn't done.    . Degenerative retinal drusen of left eye 05/03/2019  . DNR (do not resuscitate) 12/18/2014   Mrs Nelwyn Hebdon has an authorized Transport DNR (Yellow DNR) document dated 12/06/14  . FIBROADENOSIS, BREAST 03/04/2006   Qualifier: Diagnosis of  By: Eusebio Friendly    . Fibrocystic breast changes, bilateral 08/28/2016   Has had cyst aspiration in past  . Frequent stools 08/06/2017  . Frozen shoulder 10/25/2014  . 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 Hypertriglyceridemia 08/20/2011   TG 265 on 08/2011. Not in range to require treatment   . Intermediate stage nonexudative age-related macular degeneration of both eyes 05/03/2019   The nature of dry age related macular degeneration was discussed with the patient as well as its  possible conversion to wet. The results of the AREDS 2 study was discussed with the patient. A diet rich in dark leafy green vegetables was advised and specific recommendations were made regarding supplements with AREDS 2 formulation . Control of hypertension and serum cholesterol may slow the disease.  . 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 Minimal DJD changes on xray. Sunrise view wasn't done.    Marland Kitchen LBP (low back pain) 07/26/2013  . 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 Lip licking dermatitis 1/47/8295  . Macular degeneration 08/28/2016  . Pain of cervical spine 11/10/2011   left mid trapezius   . Perforated tympanic membrane on examination, left 02/17/2016  . 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 Rash of face 12/13/2018  . Retinal hemorrhage of right eye 05/03/2019  . Right shoulder pain 07/26/2013  . Seasonal allergic rhinitis 10/25/2014  . Sinusitis, acute 12/26/2012  . Unsteady gait 09/02/2012  . Valgus deformity of great toes, bilateral 08/28/2016   Past Surgical History:  Procedure Laterality Date  . BREAST CYST ASPIRATION  1999   benign fibrocystic disease  . COLONOSCOPY  09/2014   No polyps, Dr Cleaster Corin.   . COLONOSCOPY W/ BIOPSIES AND POLYPECTOMY  12/2003   tubular adenoma  . ENDOMETRIAL BIOPSY     benign  . ROTATOR CUFF REPAIR     Dr Mardelle Matte (Ortho)  . TONSILLECTOMY  1953  . TYMPANOPLASTY WITH GRAFT Left 11/17/2016   Surgeon: Vicie Mutters MD (ENT)     FAMILY HISTORY Family History  Problem Relation Age of Onset  . Neuropathy Father   . Diabetes Father   . Coronary artery disease Father   . Neuropathy Brother   . Neuropathy Brother   . Diabetes Brother     SOCIAL HISTORY Social History   Tobacco Use  . Smoking status: Never Smoker  . Smokeless tobacco: Never Used  Substance Use Topics  . Alcohol use: Yes    Alcohol/week: 0.0 standard drinks    Comment: rare alcohol  . Drug use: No         OPHTHALMIC EXAM: Base Eye Exam    Visual Acuity (Snellen - Linear)      Right Left   Dist cc E Card @ 5' 20/40 +   Dist ph cc NI NI   Correction: Glasses       Tonometry (Tonopen, 9:40 AM)      Right Left   Pressure 11 12       Pupils      Pupils Dark Light Shape React APD   Right PERRL 3 2 Round Brisk None   Left PERRL 3 2 Round Brisk None       Visual Fields (Counting fingers)      Left Right     Full Full  Poor understanding       Neuro/Psych    Oriented x3: Yes   Mood/Affect: Normal       Dilation    Both eyes: 1.0% Mydriacyl, 2.5% Phenylephrine @ 9:40 AM        Slit Lamp and Fundus Exam    External Exam      Right Left   External Normal Normal       Slit Lamp Exam  Right Left   Lids/Lashes Normal Normal   Conjunctiva/Sclera White and quiet White and quiet   Cornea Clear Clear   Anterior Chamber Deep and quiet Deep and quiet   Iris Round and reactive Round and reactive   Lens 2+ Nuclear sclerosis 2+ Nuclear sclerosis   Anterior Vitreous Normal Normal       Fundus Exam      Right Left   Posterior Vitreous Normal Normal   Disc Normal Normal   C/D Ratio 0.25 0.3   Macula Disciform scar, Hard drusen, no exudates, Macular thickening, no hemorrhage, Retinal pigment epithelial mottling, Advanced age related macular degeneration, Choroidal nevus, Geographic atrophy Drusen, Soft drusen, Intermediate age related macular degeneration   Vessels Normal    Periphery Normal           IMAGING AND PROCEDURES  Imaging and Procedures for 09/27/19  OCT, Retina - OU - Both Eyes       Right Eye Quality was good. Scan locations included subfoveal. Central Foveal Thickness: 341. Progression has been stable. Findings include disciform scar, subretinal scarring, cystoid macular edema.   Left Eye Quality was good. Scan locations included subfoveal. Central Foveal Thickness: 262. Findings include no IRF, no SRF, abnormal foveal contour, retinal drusen .   Notes OD at 13-week follow-up, post intravitreal Eylea, will repeat injection today to prevent enlargement of central scotoma.  OS, with no active disease. Will observe       Intravitreal Injection, Pharmacologic Agent - OD - Right Eye       Time Out 09/27/2019. 10:37 AM. Confirmed correct patient, procedure, site, and patient consented.   Anesthesia Topical anesthesia was used. Anesthetic medications  included Akten 3.5%.   Procedure Preparation included Tobramycin 0.3%, 10% betadine to eyelids. A 30 gauge needle was used.   Injection:  2 mg aflibercept Alfonse Flavors) SOLN   NDC: [id number], Lot: [id number]   Route: Intravitreal, Site: Right Eye, Waste: 0 mg  Post-op Post injection exam found visual acuity of at least counting fingers. The patient tolerated the procedure well. There were no complications. The patient received written and verbal post procedure care education. Post injection medications were not given.                 ASSESSMENT/PLAN:  Exudative age-related macular degeneration of right eye with active choroidal neovascularization (HCC) Central foveal scar accounts for acuity with disciform scarring yet active perifoveal CME suggest lesion activity and potential for scotoma enlargement of untreated.  Will repeat intravitreal Eylea today at 28-month follow-up and examination again right eye in 3 months  Nuclear sclerotic cataract of right eye Patient may proceed with cataract extraction in either eye as indicated by her surgeon.  I did explain that I would suggest cataract surgery in the right eye simply to balance the eyes but also teach the patient and her surgeon how the eye reacts to cataract surgery undertaking the left eye process      ICD-10-CM   1. Exudative age-related macular degeneration of right eye with active choroidal neovascularization (HCC)  H35.3211 OCT, Retina - OU - Both Eyes    Intravitreal Injection, Pharmacologic Agent - OD - Right Eye    aflibercept (EYLEA) SOLN 2 mg  2. Nuclear sclerotic cataract of right eye  H25.11     1. Repeat injection intravitreal Eylea OD today  2. Well exam OD in 3 months consider injection  3.  Ophthalmic Meds Ordered this visit:  Meds ordered this encounter  Medications  .  aflibercept (EYLEA) SOLN 2 mg       Return in about 3 months (around 12/27/2019) for dilate, OD, EYLEA OCT.  Patient  Instructions  Patient asked to report any new onset visual acuity decline or changes in either eye    Explained the diagnoses, plan, and follow up with the patient and they expressed understanding.  Patient expressed understanding of the importance of proper follow up care.   Clent Demark Trish Mancinelli M.D. Diseases & Surgery of the Retina and Vitreous [facility]ina [facility] Diabetic [place] 09/27/19     Abbreviations: M myopia (nearsighted); A astigmatism; H hyperopia (farsighted); P presbyopia; Mrx spectacle prescription;  CTL contact lenses; OD right eye; OS left eye; OU both eyes  XT exotropia; ET esotropia; PEK punctate epithelial keratitis; PEE punctate epithelial erosions; DES dry eye syndrome; MGD meibomian gland dysfunction; ATs artificial tears; PFAT's preservative free artificial tears; [place] nuclear sclerotic cataract; PSC posterior subcapsular cataract; ERM epi-retinal membrane; PVD posterior vitreous detachment; RD retinal detachment; DM diabetes mellitus; DR diabetic retinopathy; NPDR non-proliferative diabetic retinopathy; PDR proliferative diabetic retinopathy; CSME clinically significant macular edema; DME diabetic macular edema; dbh dot blot hemorrhages; CWS cotton wool spot; POAG primary open angle glaucoma; C/D cup-to-disc ratio; HVF humphrey visual field; GVF goldmann visual field; OCT optical coherence tomography; IOP intraocular pressure; BRVO Branch retinal vein occlusion; CRVO central retinal vein occlusion; CRAO central retinal artery occlusion; BRAO branch retinal artery occlusion; RT retinal tear; SB scleral buckle; PPV pars plana vitrectomy; VH Vitreous hemorrhage; PRP panretinal laser photocoagulation; IVK intravitreal kenalog; VMT vitreomacular traction; MH Macular hole;  NVD neovascularization of the disc; NVE neovascularization elsewhere; AREDS age related eye disease study; ARMD age related macular degeneration; POAG primary open angle glaucoma; EBMD epithelial/anterior basement membrane  dystrophy; ACIOL anterior chamber intraocular lens; IOL intraocular lens; PCIOL posterior chamber intraocular lens; Phaco/IOL phacoemulsification with intraocular lens placement; [place] photorefractive keratectomy; LASIK laser assisted in situ keratomileusis; HTN hypertension; DM diabetes mellitus; COPD chronic obstructive pulmonary disease

## 2019-09-27 NOTE — Assessment & Plan Note (Signed)
Central foveal scar accounts for acuity with disciform scarring yet active perifoveal CME suggest lesion activity and potential for scotoma enlargement of untreated.  Will repeat intravitreal Eylea today at 79-month follow-up and examination again right eye in 3 months

## 2019-10-04 MED FILL — FLUNISOLIDE 0.025% SPRAY: 25 MCG/ACT | 25 days supply | Qty: 25 | Fill #4

## 2019-10-05 MED FILL — GABAPENTIN 800 MG TABS: 800 | 30 days supply | Qty: 120 | Fill #7

## 2019-11-02 DIAGNOSIS — Z86010 Personal history of colonic polyps: Secondary | ICD-10-CM | POA: Diagnosis not present

## 2019-11-02 DIAGNOSIS — K921 Melena: Secondary | ICD-10-CM | POA: Diagnosis not present

## 2019-11-23 ENCOUNTER — Other Ambulatory Visit (HOSPITAL_COMMUNITY): Payer: Self-pay | Admitting: Gastroenterology

## 2019-12-05 MED FILL — GABAPENTIN 800 MG TABS: 800 | 30 days supply | Qty: 120 | Fill #8

## 2019-12-05 MED FILL — FLUNISOLIDE 0.025% SPRAY: 25 MCG/ACT | 25 days supply | Qty: 25 | Fill #5

## 2019-12-27 ENCOUNTER — Encounter (INDEPENDENT_AMBULATORY_CARE_PROVIDER_SITE_OTHER): Payer: Self-pay | Admitting: Ophthalmology

## 2019-12-27 ENCOUNTER — Encounter (INDEPENDENT_AMBULATORY_CARE_PROVIDER_SITE_OTHER): Payer: Medicare PPO | Admitting: Ophthalmology

## 2019-12-27 ENCOUNTER — Ambulatory Visit (INDEPENDENT_AMBULATORY_CARE_PROVIDER_SITE_OTHER): Payer: Medicare PPO | Admitting: Ophthalmology

## 2019-12-27 ENCOUNTER — Other Ambulatory Visit: Payer: Self-pay

## 2019-12-27 DIAGNOSIS — H353122 Nonexudative age-related macular degeneration, left eye, intermediate dry stage: Secondary | ICD-10-CM

## 2019-12-27 DIAGNOSIS — H2511 Age-related nuclear cataract, right eye: Secondary | ICD-10-CM | POA: Diagnosis not present

## 2019-12-27 DIAGNOSIS — H353114 Nonexudative age-related macular degeneration, right eye, advanced atrophic with subfoveal involvement: Secondary | ICD-10-CM

## 2019-12-27 DIAGNOSIS — H353211 Exudative age-related macular degeneration, right eye, with active choroidal neovascularization: Secondary | ICD-10-CM | POA: Diagnosis not present

## 2019-12-27 HISTORY — DX: Nonexudative age-related macular degeneration, right eye, advanced atrophic with subfoveal involvement: H35.3114

## 2019-12-27 MED ORDER — AFLIBERCEPT 2MG/0.05ML IZ SOLN FOR KALEIDOSCOPE
2.0000 mg | INTRAVITREAL | Status: AC | PRN
  Administered 2019-12-27: 10:00:00 2 mg via INTRAVITREAL

## 2019-12-27 NOTE — Assessment & Plan Note (Signed)
Accounts for acuity 

## 2019-12-27 NOTE — Progress Notes (Signed)
12/27/2019     CHIEF COMPLAINT Patient presents for Retina Follow Up (3 Month F/U OD, poss Eylea OD//Pt denies noticeable changes to [place] OU since last visit. Pt denies ocular pain, flashes of light, or floaters OU. //)   HISTORY OF PRESENT ILLNESS: Teresa Shaffer is a 79 y.o. female who presents to the clinic today for:   HPI    Retina Follow Up    Patient presents with  Wet AMD.  In right eye.  This started 3 months ago.  Severity is mild.  Duration of 3 months.  Since onset it is stable. Additional comments: 3 Month F/U OD, poss Eylea OD  Pt denies noticeable changes to [place] OU since last visit. Pt denies ocular pain, flashes of light, or floaters OU.          Last edited by Rockie Neighbours, [place] on 12/27/2019  9:01 AM. (History)      Referring physician: McDiarmid, Blane Ohara, MD [place],  [place] [id number]  HISTORICAL INFORMATION:   Selected notes from the MEDICAL RECORD NUMBER    Lab Results  Component Value Date   HGBA1C 6.3 09/07/2019     CURRENT MEDICATIONS: Current Outpatient Medications (Ophthalmic Drugs)  Medication Sig  . Aflibercept (EYLEA) 2 MG/0.05ML SOLN by Intravitreal route.   No current facility-administered medications for this visit. (Ophthalmic Drugs)   Current Outpatient Medications (Other)  Medication Sig  . Calcium Carbonate-Vitamin D (CALTRATE 600+D) 600-400 MG-UNIT per tablet Take 1 tablet by mouth 2 (two) times daily.    . cetirizine (ZYRTEC) 5 MG tablet Take 1 tablet (5 mg total) by mouth daily as needed for allergies.  . flunisolide (NASALIDE) 25 MCG/ACT (0.025%) SOLN PLACE 2 SPRAYS INTO EACH NOSTRIL TWO TIMES DAILY AS NEEDED  . gabapentin (NEURONTIN) 800 MG tablet Take 1 tablet (800 mg total) by mouth 4 (four) times daily.  . magnesium oxide (MAG-OX) 400 MG tablet Take 400 mg by mouth daily.  . Multiple Vitamin (MULTIVITAMINS PO) Take one tablet daily.   . Multiple Vitamins-Minerals (PRESERVISION AREDS) TABS Take by  mouth.   No current facility-administered medications for this visit. (Other)      REVIEW OF SYSTEMS:    ALLERGIES No Known Allergies  PAST MEDICAL HISTORY Past Medical History:  Diagnosis Date  . 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 Allergic reaction 02/07/2018  . Allergic rhinoconjunctivitis 08/05/2017  . Cerumen impaction 09/02/2012  . Chest pain 04/24/2013  . Complete rotator cuff tear 02/16/2013   On scan today she appears to have the anterior and superior capsule torn away with retraction   . 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 Minimal DJD changes on xray. Sunrise view wasn't done.    . Degenerative retinal drusen of left eye 05/03/2019  . DNR (do not resuscitate) 12/18/2014   Teresa Shaffer has an authorized Transport DNR (Yellow DNR) document dated 12/06/14  . FIBROADENOSIS, BREAST 03/04/2006   Qualifier: Diagnosis of  By: Teresa Shaffer    . Fibrocystic breast changes, bilateral 08/28/2016   Has had cyst aspiration in past  . Frequent stools 08/06/2017  . Frozen shoulder 10/25/2014  . 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 Hypertriglyceridemia 08/20/2011   TG 265 on 08/2011. Not in range to require treatment   . Intermediate stage nonexudative age-related macular degeneration of both eyes 05/03/2019   The nature of dry  age related macular degeneration was discussed with the patient as well as its possible conversion to wet. The results of the AREDS 2 study was discussed with the patient. A diet rich in dark leafy green vegetables was advised and specific recommendations were made regarding supplements with AREDS 2 formulation . Control of hypertension and serum cholesterol may slow the disease.  . 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 Minimal DJD changes on xray. Sunrise view wasn't done.    Marland Kitchen LBP (low back pain) 07/26/2013  . Leg length  inequality 12/10/2011   Left is 2 cm shorter and does not correct with sitting  This is made worse by valgus shift and does cause a mild Trendelenburg gait with walking   . Lip licking dermatitis 04/26/2015  . Macular degeneration 08/28/2016  . Pain of cervical spine 11/10/2011   left mid trapezius   . Perforated tympanic membrane on examination, left 02/17/2016  . 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 Rash of face 12/13/2018  . Retinal hemorrhage of right eye 05/03/2019  . Right shoulder pain 07/26/2013  . Seasonal allergic rhinitis 10/25/2014  . Sinusitis, acute 12/26/2012  . Unsteady gait 09/02/2012  . Valgus deformity of great toes, bilateral 08/28/2016   Past Surgical History:  Procedure Laterality Date  . BREAST CYST ASPIRATION  1999   benign fibrocystic disease  . COLONOSCOPY  09/2014   No polyps, Dr Berton Lan.   . COLONOSCOPY W/ BIOPSIES AND POLYPECTOMY  12/2003   tubular adenoma  . ENDOMETRIAL BIOPSY     benign  . ROTATOR CUFF REPAIR     Dr Dion Saucier (Ortho)  . TONSILLECTOMY  1953  . TYMPANOPLASTY WITH GRAFT Left 11/17/2016   Surgeon: Ermalinda Barrios MD (ENT)     FAMILY HISTORY Family History  Problem Relation Age of Onset  . Neuropathy Father   . Diabetes Father   . Coronary artery disease Father   . Neuropathy Brother   . Neuropathy Brother   . Diabetes Brother     SOCIAL HISTORY Social History   Tobacco Use  . Smoking status: Never Smoker  . Smokeless tobacco: Never Used  Substance Use Topics  . Alcohol use: Yes    Alcohol/week: 0.0 standard drinks    Comment: rare alcohol  . Drug use: No         OPHTHALMIC EXAM: Base Eye Exam    Visual Acuity (ETDRS)      Right Left   Dist cc E card @ 5' 20/25 -1   Dist ph cc NI    Correction: Glasses       Tonometry (Tonopen, 9:01 AM)      Right Left   Pressure 14 10       Pupils      Dark Light Shape React APD   Right 4 3 Round Slow None   Left 5 5 Round Minimal None        Visual Fields (Counting fingers)      Left Right    Full Full       Extraocular Movement      Right Left    Full Full       Neuro/Psych    Oriented x3: Yes   Mood/Affect: Normal       Dilation    Right eye: 1.0% Mydriacyl, 2.5% Phenylephrine @ 9:05 AM        Slit Lamp and Fundus Exam  External Exam      Right Left   External Normal Normal       Slit Lamp Exam      Right Left   Lids/Lashes Normal Normal   Conjunctiva/Sclera White and quiet White and quiet   Cornea Clear Clear   Anterior Chamber Deep and quiet Deep and quiet   Iris Round and reactive Round and reactive   Lens 2+ Nuclear sclerosis 2+ Nuclear sclerosis   Anterior Vitreous Normal Normal       Fundus Exam      Right Left   Posterior Vitreous Normal    Disc Normal    C/D Ratio 0.25    Macula Disciform scar, Hard drusen, no exudates, Macular thickening, no hemorrhage, Retinal pigment epithelial mottling, Advanced age related macular degeneration, Choroidal nevus, Geographic atrophy    Vessels Normal    Periphery Normal           IMAGING AND PROCEDURES  Imaging and Procedures for 12/27/19  OCT, Retina - OU - Both Eyes       Right Eye Quality was good. Scan locations included subfoveal. Central Foveal Thickness: 345. Progression has been stable. Findings include cystoid macular edema, disciform scar, subretinal scarring.   Left Eye Quality was good. Scan locations included subfoveal. Central Foveal Thickness: 264. Progression has been stable. Findings include retinal drusen , no IRF, no SRF.   Notes Subretinal scarring subfoveal, no interval change, persistent CME does persist temporal OD, repeat injection Eylea today to maintain and prevent scotoma  Enlargement  OS, dry ARMD, retinal drusen, no complications no CNVM       Intravitreal Injection, Pharmacologic Agent - OD - Right Eye       Time Out 12/27/2019. 10:19 AM. Confirmed correct patient, procedure, site, and patient  consented.   Anesthesia Topical anesthesia was used. Anesthetic medications included Akten 3.5%.   Procedure Preparation included Tobramycin 0.3%, 10% betadine to eyelids, 5% betadine to ocular surface. A 30 gauge needle was used.   Injection:  2 mg aflibercept Alfonse Flavors) SOLN   NDC: [id number], Lot: [id number]   Route: Intravitreal, Site: Right Eye, Waste: 0 mg  Post-op Post injection exam found visual acuity of at least counting fingers. The patient tolerated the procedure well. There were no complications. The patient received written and verbal post procedure care education. Post injection medications were not given.                 ASSESSMENT/PLAN:  Exudative age-related macular degeneration of right eye with active choroidal neovascularization (HCC) Disciform scar centrally, active lesion temporal portion of the scar, stable yet at 3 months, repeat Eylea today to prevent scotoma enlargement  Nuclear sclerotic cataract of right eye Cataract(s) account for the patient's complaint. I discussed the risks and benefits of cataract surgery. Options were explained to the patient. The patient understands that new glasses may not improve their vision and desires to have cataract surgery. I have recommended follow up with their general eye care doctor for evaluation and consideration of cataract extraction with new intraocular lens insertion.  I recommended patient follow-up with Dr. Craig Guess to consider cataract extraction with intraocular placement OU as the patient has some difficulty with nighttime vision difficulties in each eye.  I  I did explain to the patient I would suggest cataract surgery in the right eye first so that the surgeon and the she can understand the process fully and what to expect prior to operating on her best acuity eye,  the left  Advanced nonexudative age-related macular degeneration of right eye with subfoveal involvement Accounts for  acuity  Intermediate stage nonexudative age-related macular degeneration of left eye The nature of age--related macular degeneration was discussed with the patient as well as the distinction between dry and wet types. Checking an [place] Grid daily with advice to return immediately should a distortion develop, was given to the patient. The patient 's smoking status now and in the past was determined and advice based on the AREDS study was provided regarding the consumption of antioxidant supplements. AREDS 2 vitamin formulation was recommended. Consumption of dark leafy vegetables and fresh fruits of various colors was recommended. Treatment modalities for wet macular degeneration particularly the use of intravitreal injections of anti-blood vessel growth factors was discussed with the patient. Avastin, Lucentis, and Eylea are the available options. On occasion, therapy includes the use of photodynamic therapy and thermal laser. Stressed to the patient do not rub eyes.  Patient was advised to check Amsler Grid daily and return immediately if changes are noted. Instructions on using the grid were given to the patient. All patient questions were answered.      ICD-10-CM   1. Exudative age-related macular degeneration of right eye with active choroidal neovascularization (HCC)  H35.3211 OCT, Retina - OU - Both Eyes    Intravitreal Injection, Pharmacologic Agent - OD - Right Eye    aflibercept (EYLEA) SOLN 2 mg  2. Nuclear sclerotic cataract of right eye  H25.11   3. Advanced nonexudative age-related macular degeneration of right eye with subfoveal involvement  H35.3114   4. Intermediate stage nonexudative age-related macular degeneration of left eye  H35.3122     1.  Repeat intravitreal Avastin OD today  2.  Follow-up with Dr. Thelma Barge to consider and discuss cataract extraction with intraocular lens placement, to resolve nighttime vision difficulties, consideration for the right eye  first  3.  Ophthalmic Meds Ordered this visit:  Meds ordered this encounter  Medications  . aflibercept (EYLEA) SOLN 2 mg       Return in about 3 months (around 03/26/2020) for DILATE OU, EYLEA OCT, OD.  There are no Patient Instructions on file for this visit.   Explained the diagnoses, plan, and follow up with the patient and they expressed understanding.  Patient expressed understanding of the importance of proper follow up care.   Alford Highland Shontell Prosser M.D. Diseases & Surgery of the Retina and Vitreous [facility] 12/27/19     Abbreviations: M myopia (nearsighted); A astigmatism; H hyperopia (farsighted); P presbyopia; Mrx spectacle prescription;  CTL contact lenses; OD right eye; OS left eye; OU both eyes  XT exotropia; ET esotropia; PEK punctate epithelial keratitis; PEE punctate epithelial erosions; DES dry eye syndrome; MGD meibomian gland dysfunction; ATs artificial tears; PFAT's preservative free artificial tears; NSC nuclear sclerotic cataract; PSC posterior subcapsular cataract; ERM epi-retinal membrane; PVD posterior vitreous detachment; RD retinal detachment; DM diabetes mellitus; DR diabetic retinopathy; NPDR non-proliferative diabetic retinopathy; PDR proliferative diabetic retinopathy; CSME clinically significant macular edema; DME diabetic macular edema; dbh dot blot hemorrhages; CWS cotton wool spot; POAG primary open angle glaucoma; C/D cup-to-disc ratio; HVF humphrey visual field; GVF goldmann visual field; OCT optical coherence tomography; IOP intraocular pressure; BRVO Branch retinal vein occlusion; CRVO central retinal vein occlusion; CRAO central retinal artery occlusion; BRAO branch retinal artery occlusion; RT retinal tear; SB scleral buckle; PPV pars plana vitrectomy; VH Vitreous hemorrhage; PRP panretinal laser photocoagulation; IVK intravitreal kenalog; VMT vitreomacular  traction; MH Macular hole;  NVD neovascularization of the disc; NVE  neovascularization elsewhere; AREDS age related eye disease study; ARMD age related macular degeneration; POAG primary open angle glaucoma; EBMD epithelial/anterior basement membrane dystrophy; ACIOL anterior chamber intraocular lens; IOL intraocular lens; PCIOL posterior chamber intraocular lens; Phaco/IOL phacoemulsification with intraocular lens placement; [place] photorefractive keratectomy; LASIK laser assisted in situ keratomileusis; HTN hypertension; DM diabetes mellitus; COPD chronic obstructive pulmonary disease

## 2019-12-27 NOTE — Assessment & Plan Note (Signed)
Disciform scar centrally, active lesion temporal portion of the scar, stable yet at 3 months, repeat Eylea today to prevent scotoma enlargement

## 2019-12-27 NOTE — Assessment & Plan Note (Signed)
Cataract(s) account for the patient's complaint. I discussed the risks and benefits of cataract surgery. Options were explained to the patient. The patient understands that new glasses may not improve their vision and desires to have cataract surgery. I have recommended follow up with their general eye care doctor for evaluation and consideration of cataract extraction with new intraocular lens insertion.  I recommended patient follow-up with Dr. Craig Guess to consider cataract extraction with intraocular placement OU as the patient has some difficulty with nighttime vision difficulties in each eye.  I  I did explain to the patient I would suggest cataract surgery in the right eye first so that the surgeon and the she can understand the process fully and what to expect prior to operating on her best acuity eye, the left

## 2019-12-27 NOTE — Assessment & Plan Note (Signed)

## 2020-01-17 ENCOUNTER — Other Ambulatory Visit: Payer: Self-pay | Admitting: Family Medicine

## 2020-01-17 MED FILL — FLUNISOLIDE 0.025% SPRAY: 25 MCG/ACT | 25 days supply | Qty: 25 | Fill #6

## 2020-01-18 ENCOUNTER — Other Ambulatory Visit: Payer: Self-pay | Admitting: Family Medicine

## 2020-01-18 MED FILL — GABAPENTIN 800 MG TABS: 800 | 30 days supply | Qty: 120 | Fill #0

## 2020-02-01 DIAGNOSIS — H353113 Nonexudative age-related macular degeneration, right eye, advanced atrophic without subfoveal involvement: Secondary | ICD-10-CM | POA: Diagnosis not present

## 2020-02-01 DIAGNOSIS — H5202 Hypermetropia, left eye: Secondary | ICD-10-CM | POA: Diagnosis not present

## 2020-02-26 DIAGNOSIS — Z01812 Encounter for preprocedural laboratory examination: Secondary | ICD-10-CM | POA: Diagnosis not present

## 2020-02-26 MED FILL — PEG-3350 SOLUTION: 420 | 1 days supply | Qty: 4000 | Fill #0

## 2020-02-26 MED FILL — FLUNISOLIDE 0.025% SPRAY: 25 MCG/ACT | 25 days supply | Qty: 25 | Fill #7

## 2020-02-26 MED FILL — GABAPENTIN 800 MG TABS: 800 | 30 days supply | Qty: 120 | Fill #1

## 2020-02-29 DIAGNOSIS — D123 Benign neoplasm of transverse colon: Secondary | ICD-10-CM | POA: Diagnosis not present

## 2020-02-29 DIAGNOSIS — Z86010 Personal history of colonic polyps: Secondary | ICD-10-CM | POA: Diagnosis not present

## 2020-02-29 DIAGNOSIS — D126 Benign neoplasm of colon, unspecified: Secondary | ICD-10-CM

## 2020-02-29 DIAGNOSIS — K5730 Diverticulosis of large intestine without perforation or abscess without bleeding: Secondary | ICD-10-CM | POA: Diagnosis not present

## 2020-02-29 HISTORY — DX: Benign neoplasm of colon, unspecified: D12.6

## 2020-03-05 DIAGNOSIS — D123 Benign neoplasm of transverse colon: Secondary | ICD-10-CM | POA: Diagnosis not present

## 2020-03-19 ENCOUNTER — Encounter: Payer: Self-pay | Admitting: Family Medicine

## 2020-03-26 ENCOUNTER — Ambulatory Visit (INDEPENDENT_AMBULATORY_CARE_PROVIDER_SITE_OTHER): Payer: Medicare PPO | Admitting: Ophthalmology

## 2020-03-26 ENCOUNTER — Other Ambulatory Visit: Payer: Self-pay

## 2020-03-26 ENCOUNTER — Encounter (INDEPENDENT_AMBULATORY_CARE_PROVIDER_SITE_OTHER): Payer: Self-pay | Admitting: Ophthalmology

## 2020-03-26 DIAGNOSIS — H2511 Age-related nuclear cataract, right eye: Secondary | ICD-10-CM

## 2020-03-26 DIAGNOSIS — H353122 Nonexudative age-related macular degeneration, left eye, intermediate dry stage: Secondary | ICD-10-CM | POA: Diagnosis not present

## 2020-03-26 DIAGNOSIS — H353211 Exudative age-related macular degeneration, right eye, with active choroidal neovascularization: Secondary | ICD-10-CM | POA: Diagnosis not present

## 2020-03-26 MED ORDER — AFLIBERCEPT 2MG/0.05ML IZ SOLN FOR KALEIDOSCOPE
2.0000 mg | INTRAVITREAL | Status: AC | PRN
  Administered 2020-03-26: 2 mg via INTRAVITREAL

## 2020-03-26 NOTE — Progress Notes (Signed)
03/26/2020     CHIEF COMPLAINT Patient presents for Retina Follow Up (3 Month Wet AMD f\u. Possible Eylea OD. OCT/Pt states vision is stable. Denies floaters and FOL.)   HISTORY OF PRESENT ILLNESS: Teresa Shaffer is a 80 y.o. female who presents to the clinic today for:   HPI    Retina Follow Up    Patient presents with  Wet AMD.  In right eye.  Severity is moderate.  Duration of 3 months.  Since onset it is stable. Additional comments: 3 Month Wet AMD f\u. Possible Eylea OD. OCT Pt states vision is stable. Denies floaters and FOL.       Last edited by Tilda Franco on 03/26/2020  9:21 AM. (History)      Referring physician: McDiarmid, Blane Ohara, MD [place],  [place] [address]  HISTORICAL INFORMATION:   Selected notes from the MEDICAL RECORD NUMBER    Lab Results  Component Value Date   HGBA1C 6.3 09/07/2019     CURRENT MEDICATIONS: Current Outpatient Medications (Ophthalmic Drugs)  Medication Sig  . Aflibercept (EYLEA) 2 MG/0.05ML SOLN by Intravitreal route.   No current facility-administered medications for this visit. (Ophthalmic Drugs)   Current Outpatient Medications (Other)  Medication Sig  . Calcium Carbonate-Vitamin D (CALTRATE 600+D) 600-400 MG-UNIT per tablet Take 1 tablet by mouth 2 (two) times daily.    . cetirizine (ZYRTEC) 5 MG tablet Take 1 tablet (5 mg total) by mouth daily as needed for allergies.  . flunisolide (NASALIDE) 25 MCG/ACT (0.025%) SOLN PLACE 2 SPRAYS INTO EACH NOSTRIL TWO TIMES DAILY AS NEEDED  . gabapentin (NEURONTIN) 800 MG tablet TAKE 1 TABLET BY MOUTH 4 TIMES DAILY  . magnesium oxide (MAG-OX) 400 MG tablet Take 400 mg by mouth daily.  . Multiple Vitamin (MULTIVITAMINS PO) Take one tablet daily.   . Multiple Vitamins-Minerals (PRESERVISION AREDS) TABS Take by mouth.   No current facility-administered medications for this visit. (Other)      REVIEW OF SYSTEMS:    ALLERGIES No Known Allergies  PAST  MEDICAL HISTORY Past Medical History:  Diagnosis Date  . 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 Allergic reaction 02/07/2018  . Allergic rhinoconjunctivitis 08/05/2017  . Cerumen impaction 09/02/2012  . Chest pain 04/24/2013  . Complete rotator cuff tear 02/16/2013   On scan today she appears to have the anterior and superior capsule torn away with retraction   . 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 Minimal DJD changes on xray. Sunrise view wasn't done.    . Degenerative retinal drusen of left eye 05/03/2019  . DNR (do not resuscitate) 12/18/2014   Mrs Teresa Shaffer has an authorized Transport DNR (Yellow DNR) document dated 12/06/14  . FIBROADENOSIS, BREAST 03/04/2006   Qualifier: Diagnosis of  By: Eusebio Friendly    . Fibrocystic breast changes, bilateral 08/28/2016   Has had cyst aspiration in past  . Frequent stools 08/06/2017  . Frozen shoulder 10/25/2014  . 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 Hypertriglyceridemia 08/20/2011   TG 265 on 08/2011. Not in range to require treatment   . Intermediate stage nonexudative age-related macular degeneration of both eyes 05/03/2019   The nature of dry age related macular degeneration was discussed with the patient as well as its possible conversion to wet. The results of the AREDS 2 study was discussed with the patient. A diet rich in  dark leafy green vegetables was advised and specific recommendations were made regarding supplements with AREDS 2 formulation . Control of hypertension and serum cholesterol may slow the disease.  . 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 Minimal DJD changes on xray. Sunrise view wasn't done.    Marland Kitchen LBP (low back pain) 07/26/2013  . 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 Lip licking dermatitis 5/39/7673  . Macular degeneration 08/28/2016  . Pain of cervical spine 11/10/2011   left mid trapezius   . Perforated tympanic membrane on examination, left 02/17/2016  . 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 Rash of face 12/13/2018  . Retinal hemorrhage of right eye 05/03/2019  . Right shoulder pain 07/26/2013  . Seasonal allergic rhinitis 10/25/2014  . Sinusitis, acute 12/26/2012  . Tubular adenoma of colon 02/29/2020   M. Magod MD [facility]) - diagnostic colonoscopy for hematochezia  . Unsteady gait 09/02/2012  . Valgus deformity of great toes, bilateral 08/28/2016   Past Surgical History:  Procedure Laterality Date  . BREAST CYST ASPIRATION  1999   benign fibrocystic disease  . COLONOSCOPY  09/2014   No polyps, Dr Cleaster Corin.   . COLONOSCOPY W/ BIOPSIES AND POLYPECTOMY  12/2003   tubular adenoma  . ENDOMETRIAL BIOPSY     benign  . ROTATOR CUFF REPAIR     Dr Mardelle Matte (Ortho)  . TONSILLECTOMY  1953  . TYMPANOPLASTY WITH GRAFT Left 11/17/2016   Surgeon: Vicie Mutters MD (ENT)     FAMILY HISTORY Family History  Problem Relation Age of Onset  . Neuropathy Father   . Diabetes Father   . Coronary artery disease Father   . Neuropathy Brother   . Neuropathy Brother   . Diabetes Brother     SOCIAL HISTORY Social History   Tobacco Use  . Smoking status: Never Smoker  . Smokeless tobacco: Never Used  Substance Use Topics  . Alcohol use: Yes    Alcohol/week: 0.0 standard drinks    Comment: rare alcohol  . Drug use: No         OPHTHALMIC EXAM: Base Eye Exam    Visual Acuity (Snellen - Linear)      Right Left   Dist cc CF @ 4' 20/30 +2   Dist ph cc NI        Tonometry (Tonopen, 9:26 AM)      Right Left   Pressure 13 13       Pupils      Pupils Dark Light Shape React APD   Right PERRL 4 3 Round Slow None   Left PERRL 4 3 Round Slow None       Visual Fields (Counting fingers)      Left Right     Full Full       Neuro/Psych    Oriented x3: Yes   Mood/Affect: Normal       Dilation    Both eyes: 1.0% Mydriacyl, 2.5% Phenylephrine @ 9:26 AM        Slit Lamp and Fundus Exam    External Exam      Right Left   External Normal Normal       Slit Lamp Exam      Right Left   Lids/Lashes Normal  Normal   Conjunctiva/Sclera White and quiet White and quiet   Cornea Clear Clear   Anterior Chamber Deep and quiet Deep and quiet   Iris Round and reactive Round and reactive   Lens 2+ Nuclear sclerosis 2+ Nuclear sclerosis   Anterior Vitreous Normal Normal       Fundus Exam      Right Left   Posterior Vitreous Normal Normal   Disc Normal Normal   C/D Ratio 0.25 0.3   Macula Disciform scar, Hard drusen, no exudates, Macular thickening, no hemorrhage, Retinal pigment epithelial mottling, Advanced age related macular degeneration, Choroidal nevus, Geographic atrophy Drusen, Soft drusen, Intermediate age related macular degeneration, no membrane, no hemorrhage   Vessels Normal Normal   Periphery Normal Normal          IMAGING AND PROCEDURES  Imaging and Procedures for 03/26/20  OCT, Retina - OU - Both Eyes       Right Eye Quality was good. Scan locations included subfoveal. Central Foveal Thickness: 326. Progression has improved. Findings include subretinal scarring, disciform scar.   Left Eye Quality was good. Scan locations included subfoveal. Central Foveal Thickness: 261. Progression has been stable.   Notes No signs of CNVM left eye  OD vastly improved macular thickness as compared to onset of disease December 2019 as well as no active CNVM at 17-month interval.       Intravitreal Injection, Pharmacologic Agent - OD - Right Eye       Time Out 03/26/2020. 10:22 AM. Confirmed correct patient, procedure, site, and patient consented.   Anesthesia Topical anesthesia was used. Anesthetic medications included Akten 3.5%.   Procedure Preparation included  Tobramycin 0.3%, 10% betadine to eyelids, 5% betadine to ocular surface. A 30 gauge needle was used.   Injection:  2 mg aflibercept Alfonse Flavors) SOLN   NDC: [id number], Lot: [id number]   Route: Intravitreal, Site: Right Eye, Waste: 0 mg  Post-op Post injection exam found visual acuity of at least counting fingers. The patient tolerated the procedure well. There were no complications. The patient received written and verbal post procedure care education. Post injection medications were not given.                 ASSESSMENT/PLAN:  Nuclear sclerotic cataract of right eye The nature of cataract was discussed with the patient as well as the elective nature of surgery. The patient was reassured that surgery at a later date does not put the patient at risk for a worse outcome. It was emphasized that the need for surgery is dictated by the patient's quality of life as influenced by the cataract. Patient was instructed to maintain close follow up with their general eye care doctor.  Intermediate stage nonexudative age-related macular degeneration of left eye The nature of age-related macular degeneration was discussed with the patient as well as the distinction between dry and wet types. Checking an Amsler Grid daily with advice to return immediately should a distortion develop, was given to the patient. The patient 's smoking status now and in the past was determined and advice based on the AREDS study was provided regarding the consumption of antioxidant supplements. AREDS 2 vitamin formulation was recommended. Consumption of dark leafy vegetables and fresh fruits of various colors was recommended. Treatment modalities for wet macular degeneration particularly the use of intravitreal injections of anti-blood vessel growth factors was discussed with the patient. Avastin, Lucentis, and Eylea are the available options. On occasion, therapy includes the use of photodynamic therapy and  thermal laser.  Stressed to the patient do not rub eyes.  Patient was advised to check Amsler Grid daily and return immediately if changes are noted. Instructions on using the grid were given to the patient. All patient questions were answered.  No signs of CNVM OS      ICD-10-CM   1. Exudative age-related macular degeneration of right eye with active choroidal neovascularization (HCC)  H35.3211 OCT, Retina - OU - Both Eyes    Intravitreal Injection, Pharmacologic Agent - OD - Right Eye    aflibercept (EYLEA) SOLN 2 mg  2. Nuclear sclerotic cataract of right eye  H25.11   3. Intermediate stage nonexudative age-related macular degeneration of left eye  H35.3122     1.  Repeat intravitreal Eylea today OD in order to prevent enlargement of scotoma from subfoveal disciform scar.  Stable today at 6-month follow-up  2.  Dilate OU next, 12-month interval  3.  Ophthalmic Meds Ordered this visit:  Meds ordered this encounter  Medications  . aflibercept (EYLEA) SOLN 2 mg       Return in about 4 months (around 07/26/2020) for DILATE OU, EYLEA OCT, OD.  Patient Instructions  Patient instructed to contact the office promptly for new onset visual acuity declines or distortions    Explained the diagnoses, plan, and follow up with the patient and they expressed understanding.  Patient expressed understanding of the importance of proper follow up care.   Clent Demark Rankin M.D. Diseases & Surgery of the Retina and Vitreous [facility] [place] 03/26/20     Abbreviations: M myopia (nearsighted); A astigmatism; H hyperopia (farsighted); P presbyopia; Mrx spectacle prescription;  CTL contact lenses; OD right eye; OS left eye; OU both eyes  XT exotropia; ET esotropia; PEK punctate epithelial keratitis; PEE punctate epithelial erosions; DES dry eye syndrome; MGD meibomian gland dysfunction; ATs artificial tears; PFAT's preservative free artificial tears; [place] nuclear sclerotic cataract; PSC posterior  subcapsular cataract; ERM epi-retinal membrane; PVD posterior vitreous detachment; RD retinal detachment; DM diabetes mellitus; DR diabetic retinopathy; NPDR non-proliferative diabetic retinopathy; PDR proliferative diabetic retinopathy; CSME clinically significant macular edema; DME diabetic macular edema; dbh dot blot hemorrhages; CWS cotton wool spot; POAG primary open angle glaucoma; C/D cup-to-disc ratio; HVF humphrey visual field; GVF goldmann visual field; OCT optical coherence tomography; IOP intraocular pressure; BRVO Branch retinal vein occlusion; CRVO central retinal vein occlusion; CRAO central retinal artery occlusion; BRAO branch retinal artery occlusion; RT retinal tear; SB scleral buckle; PPV pars plana vitrectomy; VH Vitreous hemorrhage; PRP panretinal laser photocoagulation; IVK intravitreal kenalog; VMT vitreomacular traction; MH Macular hole;  NVD neovascularization of the disc; NVE neovascularization elsewhere; AREDS age related eye disease study; ARMD age related macular degeneration; POAG primary open angle glaucoma; EBMD epithelial/anterior basement membrane dystrophy; ACIOL anterior chamber intraocular lens; IOL intraocular lens; PCIOL posterior chamber intraocular lens; Phaco/IOL phacoemulsification with intraocular lens placement; [place] photorefractive keratectomy; LASIK laser assisted in situ keratomileusis; HTN hypertension; DM diabetes mellitus; COPD chronic obstructive pulmonary disease

## 2020-03-26 NOTE — Assessment & Plan Note (Signed)

## 2020-03-26 NOTE — Assessment & Plan Note (Signed)
The nature of age--related macular degeneration was discussed with the patient as well as the distinction between dry and wet types. Checking an Amsler Grid daily with advice to return immediately should a distortion develop, was given to the patient. The patient 's smoking status now and in the past was determined and advice based on the AREDS study was provided regarding the consumption of antioxidant supplements. AREDS 2 vitamin formulation was recommended. Consumption of dark leafy vegetables and fresh fruits of various colors was recommended. Treatment modalities for wet macular degeneration particularly the use of intravitreal injections of anti-blood vessel growth factors was discussed with the patient. Avastin, Lucentis, and Eylea are the available options. On occasion, therapy includes the use of photodynamic therapy and thermal laser. Stressed to the patient do not rub eyes.  Patient was advised to check Amsler Grid daily and return immediately if changes are noted. Instructions on using the grid were given to the patient. All patient questions were answered.  No signs of CNVM OS

## 2020-03-26 NOTE — Patient Instructions (Signed)
Patient instructed to contact the office promptly for new onset visual acuity declines or distortions 

## 2020-03-28 MED FILL — GABAPENTIN 800 MG TABS: 800 | 30 days supply | Qty: 120 | Fill #2

## 2020-03-28 MED FILL — FLUNISOLIDE 0.025% SPRAY: 25 MCG/ACT | 25 days supply | Qty: 25 | Fill #8

## 2020-05-03 ENCOUNTER — Other Ambulatory Visit: Payer: Self-pay | Admitting: Family Medicine

## 2020-05-03 ENCOUNTER — Other Ambulatory Visit (HOSPITAL_COMMUNITY): Payer: Self-pay

## 2020-05-03 MED ORDER — FLUNISOLIDE 25 MCG/ACT (0.025%) NA SOLN
NASAL | 99 refills | Status: DC
  Filled 2020-05-03: qty 25, 25d supply, fill #0
  Filled 2020-05-14: qty 25, 30d supply, fill #0
  Filled 2020-06-24: qty 25, 30d supply, fill #1
  Filled 2020-08-01: qty 25, 25d supply, fill #2
  Filled 2020-09-02: qty 25, 25d supply, fill #3
  Filled 2020-10-03: qty 25, 25d supply, fill #4
  Filled 2020-11-22: qty 25, 25d supply, fill #5
  Filled 2020-12-19: qty 25, 25d supply, fill #6
  Filled 2021-02-07: qty 25, 25d supply, fill #7
  Filled ????-??-??: fill #7

## 2020-05-03 MED FILL — Gabapentin Tab 800 MG: ORAL | 30 days supply | Qty: 120 | Fill #0 | Status: CN

## 2020-05-06 ENCOUNTER — Other Ambulatory Visit (HOSPITAL_COMMUNITY): Payer: Self-pay

## 2020-05-13 ENCOUNTER — Other Ambulatory Visit (HOSPITAL_COMMUNITY): Payer: Self-pay

## 2020-05-14 ENCOUNTER — Other Ambulatory Visit (HOSPITAL_COMMUNITY): Payer: Self-pay

## 2020-05-14 MED FILL — Gabapentin Tab 800 MG: ORAL | 30 days supply | Qty: 120 | Fill #0 | Status: AC

## 2020-06-18 ENCOUNTER — Ambulatory Visit: Payer: Medicare PPO

## 2020-06-19 ENCOUNTER — Other Ambulatory Visit: Payer: Self-pay | Admitting: Orthopedic Surgery

## 2020-06-19 DIAGNOSIS — M19012 Primary osteoarthritis, left shoulder: Secondary | ICD-10-CM

## 2020-06-24 ENCOUNTER — Other Ambulatory Visit (HOSPITAL_COMMUNITY): Payer: Self-pay

## 2020-06-24 MED FILL — Gabapentin Tab 800 MG: ORAL | 30 days supply | Qty: 120 | Fill #1 | Status: AC

## 2020-06-25 ENCOUNTER — Other Ambulatory Visit (HOSPITAL_COMMUNITY): Payer: Self-pay

## 2020-06-25 ENCOUNTER — Other Ambulatory Visit: Payer: Self-pay | Admitting: Family Medicine

## 2020-06-25 DIAGNOSIS — Z1231 Encounter for screening mammogram for malignant neoplasm of breast: Secondary | ICD-10-CM

## 2020-06-29 ENCOUNTER — Other Ambulatory Visit: Payer: Self-pay

## 2020-06-29 ENCOUNTER — Ambulatory Visit
Admission: RE | Admit: 2020-06-29 | Discharge: 2020-06-29 | Disposition: A | Discharge status: Home / Self Care | Payer: Medicare PPO | Source: Ambulatory Visit | Attending: Family Medicine | Admitting: Family Medicine

## 2020-06-29 ENCOUNTER — Ambulatory Visit
Admission: RE | Admit: 2020-06-29 | Discharge: 2020-06-29 | Disposition: A | Discharge status: Home / Self Care | Payer: Medicare PPO | Source: Ambulatory Visit | Attending: Orthopedic Surgery | Admitting: Orthopedic Surgery

## 2020-06-29 DIAGNOSIS — M7552 Bursitis of left shoulder: Secondary | ICD-10-CM | POA: Diagnosis not present

## 2020-06-29 DIAGNOSIS — M25412 Effusion, left shoulder: Secondary | ICD-10-CM | POA: Diagnosis not present

## 2020-06-29 DIAGNOSIS — M19012 Primary osteoarthritis, left shoulder: Secondary | ICD-10-CM | POA: Diagnosis not present

## 2020-06-29 DIAGNOSIS — Z1231 Encounter for screening mammogram for malignant neoplasm of breast: Secondary | ICD-10-CM

## 2020-07-09 ENCOUNTER — Encounter: Payer: Self-pay | Admitting: Family Medicine

## 2020-07-09 NOTE — Progress Notes (Signed)
Request for opinion about surgical risk for total shoulder replacement.  I gave opinion that patient is optimized for surgery from a medical standpoint.  No risk that would necessitate cardiology consultation.

## 2020-07-22 DIAGNOSIS — M12812 Other specific arthropathies, not elsewhere classified, left shoulder: Secondary | ICD-10-CM | POA: Diagnosis present

## 2020-07-22 DIAGNOSIS — M19012 Primary osteoarthritis, left shoulder: Secondary | ICD-10-CM | POA: Diagnosis not present

## 2020-07-22 HISTORY — DX: Other specific arthropathies, not elsewhere classified, left shoulder: M12.812

## 2020-07-25 ENCOUNTER — Ambulatory Visit: Payer: Medicare PPO | Admitting: Family Medicine

## 2020-07-29 ENCOUNTER — Encounter (HOSPITAL_COMMUNITY): Payer: Medicare PPO

## 2020-07-29 NOTE — Patient Instructions (Addendum)
DUE TO COVID-19 ONLY ONE VISITOR IS ALLOWED TO COME WITH YOU AND STAY IN THE WAITING ROOM ONLY DURING PRE OP AND PROCEDURE DAY OF SURGERY.   TWO VISITOR  MAY VISIT WITH YOU AFTER SURGERY IN YOUR PRIVATE ROOM DURING VISITING HOURS ONLY!  YOU NEED TO HAVE A COVID 19 TEST On   _not needed_          Your procedure is scheduled on: 08-06-20   Report to [facility] Main  Entrance   Report to admitting at       [place] AM     Call this number if you have problems the morning of surgery [phone]    Remember: NO SOLID FOOD AFTER MIDNIGHT THE NIGHT PRIOR TO SURGERY. NOTHING BY MOUTH EXCEPT CLEAR LIQUIDS UNTIL     0800 am then nothing by mouth.   PLEASE FINISH G2  DRINK PER SURGEON ORDER  WHICH NEEDS TO BE COMPLETED AT     0800 am then nothing by mouth.     CLEAR LIQUID DIET   Foods Allowed                                                                     Foods Excluded water Black Coffee and tea, regular and decaf                             liquids that you cannot  Plain Jell-O any favor except red or purple                                           see through such as: Fruit ices (not with fruit pulp)                                                   milk, soups, orange juice  Iced Popsicles                                                                 All solid food Carbonated beverages, regular and diet                                    Cranberry, grape and apple juices Sports drinks like Gatorade Lightly seasoned clear broth or consume(fat free) Sugar, honey syrup  _____________________________________________________________________    BRUSH YOUR TEETH MORNING OF SURGERY AND RINSE YOUR MOUTH OUT, NO CHEWING GUM CANDY OR MINTS.     Take these medicines the morning of surgery with A SIP OF WATER: gabapentin, zyrtec, nasal spray  DO NOT TAKE ANY DIABETIC MEDICATIONS DAY OF YOUR SURGERY  You may not have any metal on your body including  hair pins and              piercings  Do not wear jewelry, make-up, lotions, powders or perfumes, deodorant             Do not wear nail polish on your fingernails or toenails .  Do not shave  48 hours prior to surgery.              Do not bring valuables to the hospital. [place].  Contacts, dentures or bridgework may not be worn into surgery.       Patients discharged the day of surgery will not be allowed to drive home. IF YOU ARE HAVING SURGERY AND GOING HOME THE SAME DAY, YOU MUST HAVE AN ADULT TO DRIVE YOU HOME AND BE WITH YOU FOR 24 HOURS. YOU MAY GO HOME BY TAXI OR UBER OR ORTHERWISE, BUT AN ADULT MUST ACCOMPANY YOU HOME AND STAY WITH YOU FOR 24 HOURS.  Name and phone number of your driver:  Special Instructions: N/A              Please read over the following fact sheets you were given: _____________________________________________________________________ [facility]- Preparing for Total Shoulder Arthroplasty    Before surgery, you can play an important role. Because skin is not sterile, your skin needs to be as free of germs as possible. You can reduce the number of germs on your skin by using the following products. Benzoyl Peroxide Gel Reduces the number of germs present on the skin Applied twice a day to shoulder area starting two days before surgery    ==================================================================  Please follow these instructions carefully:  BENZOYL PEROXIDE 5% GEL  Please do not use if you have an allergy to benzoyl peroxide.   If your skin becomes reddened/irritated stop using the benzoyl peroxide.  Starting two days before surgery, apply as follows: Apply benzoyl peroxide in the morning and at night. Apply after taking a shower. If you are not taking a shower clean entire shoulder front, back, and side along with the armpit with a clean wet washcloth.  Place a quarter-sized dollop on your  shoulder and rub in thoroughly, making sure to cover the front, back, and side of your shoulder, along with the armpit.   2 days before ____ AM   ____ PM              1 day before ____ AM   ____ PM                         Do this twice a day for two days.  (Last application is the night before surgery, AFTER using the CHG soap as described below).  Do NOT apply benzoyl peroxide gel on the day of surgery.             [place] - Preparing for Surgery Before surgery, you can play an important role.  Because skin is not sterile, your skin needs to be as free of germs as possible.  You can reduce the number of germs on your skin by washing with CHG (chlorahexidine gluconate) soap before surgery.  CHG is an antiseptic cleaner which kills germs and bonds with the skin to continue killing germs even after washing. Please DO NOT use if  you have an allergy to CHG or antibacterial soaps.  If your skin becomes reddened/irritated stop using the CHG and inform your nurse when you arrive at Short Stay. Do not shave (including legs and underarms) for at least 48 hours prior to the first CHG shower.  You may shave your face/neck. Please follow these instructions carefully:  1.  Shower with CHG Soap the night before surgery and the  morning of Surgery.  2.  If you choose to wash your hair, wash your hair first as usual with your  normal  shampoo.  3.  After you shampoo, rinse your hair and body thoroughly to remove the  shampoo.                           4.  Use CHG as you would any other liquid soap.  You can apply chg directly  to the skin and wash                       Gently with a scrungie or clean washcloth.  5.  Apply the CHG Soap to your body ONLY FROM THE NECK DOWN.   Do not use on face/ open                           Wound or open sores. Avoid contact with eyes, ears mouth and genitals (private parts).                       Wash face,  Genitals (private parts) with your normal soap.             6.   Wash thoroughly, paying special attention to the area where your surgery  will be performed.  7.  Thoroughly rinse your body with warm water from the neck down.  8.  DO NOT shower/wash with your normal soap after using and rinsing off  the CHG Soap.                9.  Pat yourself dry with a clean towel.            10.  Wear clean pajamas.            11.  Place clean sheets on your bed the night of your first shower and do not  sleep with pets. Day of Surgery : Do not apply any lotions/deodorants the morning of surgery.  Please wear clean clothes to the hospital/surgery center.  FAILURE TO FOLLOW THESE INSTRUCTIONS MAY RESULT IN THE CANCELLATION OF YOUR SURGERY PATIENT SIGNATURE_________________________________  NURSE SIGNATURE__________________________________   Incentive Spirometer  An incentive spirometer is a tool that can help keep your lungs clear and active. This tool measures how well you are filling your lungs with each breath. Taking long deep breaths may help reverse or decrease the chance of developing breathing (pulmonary) problems (especially infection) following: A long period of time when you are unable to move or be active. BEFORE THE PROCEDURE  If the spirometer includes an indicator to show your best effort, your nurse or respiratory therapist will set it to a desired goal. If possible, sit up straight or lean slightly forward. Try not to slouch. Hold the incentive spirometer in an upright position. INSTRUCTIONS FOR USE  Sit on the edge of your bed if possible, or sit up as far as you can in bed  or on a chair. Hold the incentive spirometer in an upright position. Breathe out normally. Place the mouthpiece in your mouth and seal your lips tightly around it. Breathe in slowly and as deeply as possible, raising the piston or the ball toward the top of the column. Hold your breath for 3-5 seconds or for as long as possible. Allow the piston or ball to fall to the bottom of  the column. Remove the mouthpiece from your mouth and breathe out normally. Rest for a few seconds and repeat Steps 1 through 7 at least 10 times every 1-2 hours when you are awake. Take your time and take a few normal breaths between deep breaths. The spirometer may include an indicator to show your best effort. Use the indicator as a goal to work toward during each repetition. After each set of 10 deep breaths, practice coughing to be sure your lungs are clear. If you have an incision (the cut made at the time of surgery), support your incision when coughing by placing a pillow or rolled up towels firmly against it. Once you are able to get out of bed, walk around indoors and cough well. You may stop using the incentive spirometer when instructed by your caregiver.  RISKS AND COMPLICATIONS Take your time so you do not get dizzy or light-headed. If you are in pain, you may need to take or ask for pain medication before doing incentive spirometry. It is harder to take a deep breath if you are having pain. AFTER USE Rest and breathe slowly and easily. It can be helpful to keep track of a log of your progress. Your caregiver can provide you with a simple table to help with this. If you are using the spirometer at home, follow these instructions: [place] IF:  You are having difficultly using the spirometer. You have trouble using the spirometer as often as instructed. Your pain medication is not giving enough relief while using the spirometer. You develop fever of 100.5 F (38.1 C) or higher. SEEK IMMEDIATE MEDICAL CARE IF:  You cough up bloody sputum that had not been present before. You develop fever of 102 F (38.9 C) or greater. You develop worsening pain at or near the incision site. MAKE SURE YOU:  Understand these instructions. Will watch your condition. Will get help right away if you are not doing well or get worse. Document Released: 05/04/2006 Document Revised:  03/16/2011 Document Reviewed: 07/05/2006 [facility] Patient Information 2014 [employer], [place].   ________________________________________________________________________  ________________________________________________________________________

## 2020-07-29 NOTE — Progress Notes (Addendum)
PCP - Sherren Mocha McDiarmid 07-09-20 clearance Cardiologist - no  PPM/ICD -  Device Orders -  Rep Notified -   Chest x-ray -  EKG -  Stress Test -  ECHO -  Cardiac Cath -   Sleep Study -  CPAP -   Fasting Blood Sugar -  Checks Blood Sugar _____ times a day  Blood Thinner Instructions: Aspirin Instructions:  ERAS Protcol - PRE-SURGERY Ensure or G2-   COVID TEST- ambulatory  Activity--Able to walk a flight of stairs without SOB Anesthesia review:   Patient denies shortness of breath, fever, cough and chest pain at PAT appointment   All instructions explained to the patient, with a verbal understanding of the material. Patient agrees to go over the instructions while at home for a better understanding. Patient also instructed to self quarantine after being tested for COVID-19. The opportunity to ask questions was provided.

## 2020-07-30 ENCOUNTER — Ambulatory Visit (INDEPENDENT_AMBULATORY_CARE_PROVIDER_SITE_OTHER): Payer: Medicare PPO | Admitting: Ophthalmology

## 2020-07-30 ENCOUNTER — Encounter (INDEPENDENT_AMBULATORY_CARE_PROVIDER_SITE_OTHER): Payer: Self-pay | Admitting: Ophthalmology

## 2020-07-30 ENCOUNTER — Other Ambulatory Visit: Payer: Self-pay

## 2020-07-30 DIAGNOSIS — H2511 Age-related nuclear cataract, right eye: Secondary | ICD-10-CM | POA: Diagnosis not present

## 2020-07-30 DIAGNOSIS — H2512 Age-related nuclear cataract, left eye: Secondary | ICD-10-CM

## 2020-07-30 DIAGNOSIS — H2513 Age-related nuclear cataract, bilateral: Secondary | ICD-10-CM

## 2020-07-30 DIAGNOSIS — H353211 Exudative age-related macular degeneration, right eye, with active choroidal neovascularization: Secondary | ICD-10-CM | POA: Diagnosis not present

## 2020-07-30 DIAGNOSIS — H43813 Vitreous degeneration, bilateral: Secondary | ICD-10-CM

## 2020-07-30 DIAGNOSIS — H353122 Nonexudative age-related macular degeneration, left eye, intermediate dry stage: Secondary | ICD-10-CM

## 2020-07-30 HISTORY — DX: Vitreous degeneration, bilateral: H43.813

## 2020-07-30 MED ORDER — AFLIBERCEPT 2MG/0.05ML IZ SOLN FOR KALEIDOSCOPE
2.0000 mg | INTRAVITREAL | Status: AC | PRN
  Administered 2020-07-30: 2 mg via INTRAVITREAL

## 2020-07-30 NOTE — Assessment & Plan Note (Signed)
Patient beginning to have visual difficulty with activities of daily living.  Likely from the left eye variable from cataract.  I have asked patient to follow-up with Dr. [name] as scheduled  She may consider having cataract surgery in his left eye, after the right eye, so as to maximize her visual potential for the rest of life

## 2020-07-30 NOTE — Progress Notes (Addendum)
07/30/2020     CHIEF COMPLAINT Patient presents for Retina Follow Up (4 month fu OU and Eylea OD/Pt states, "I am thinking that my cataracts are getting worse because I am having progressively more blurry vision. I have to see Dr. Delman Shaffer on Thursday to have them checked."//)   HISTORY OF PRESENT ILLNESS: Teresa Shaffer is a 80 y.o. female who presents to the clinic today for:   HPI     Retina Follow Up           Diagnosis: Wet AMD   Laterality: right eye   Onset: 4 months ago   Severity: mild   Duration: 4 months   Course: gradually worsening   Comments: 4 month fu OU and Eylea OD Pt states, "I am thinking that my cataracts are getting worse because I am having progressively more blurry vision. I have to see Dr. Delman Shaffer on Thursday to have them checked."           Comments   OS can sometimes cannot see as well as she would like to.      Last edited by Teresa Horn, MD on 07/30/2020  9:49 AM.      Referring physician: [name], MD [address], [place],  [place] [address]  HISTORICAL INFORMATION:   Selected notes from the MEDICAL RECORD NUMBER    Lab Results  Component Value Date   HGBA1C 6.3 09/07/2019     CURRENT MEDICATIONS: Current Outpatient Medications (Ophthalmic Drugs)  Medication Sig   Aflibercept (EYLEA) 2 MG/0.05ML SOLN by Intravitreal route every 3 (three) months.   No current facility-administered medications for this visit. (Ophthalmic Drugs)   Current Outpatient Medications (Other)  Medication Sig   Calcium Carbonate-Vitamin D 600-400 MG-UNIT tablet Take 1 tablet by mouth daily.   cetirizine (ZYRTEC) 10 MG tablet Take 10 mg by mouth daily.   flunisolide (NASALIDE) 25 MCG/ACT (0.025%) SOLN PLACE 2 SPRAYS INTO EACH NOSTRIL 2 TIMES DAILY AS NEEDED (Patient taking differently: Place 2 sprays into the nose in the morning and at bedtime.)   gabapentin (NEURONTIN) 800 MG tablet TAKE 1 TABLET BY MOUTH 4 TIMES DAILY (Patient taking  differently: Take 800 mg by mouth 4 (four) times daily.)   hydrocortisone cream 1 % Apply 1 application topically daily. Apply to face   magnesium oxide (MAG-OX) 400 MG tablet Take 400 mg by mouth daily.   Multiple Vitamin (MULTIVITAMINS PO) Take 1 tablet by mouth daily.   Multiple Vitamins-Minerals (PRESERVISION AREDS) TABS Take 1 capsule by mouth in the morning and at bedtime.   polyethylene glycol-electrolytes (NULYTELY) 420 g solution USE AS DIRECTED   No current facility-administered medications for this visit. (Other)      REVIEW OF SYSTEMS:    ALLERGIES No Known Allergies  PAST MEDICAL HISTORY Past Medical History:  Diagnosis Date   Adhesive capsulitis of right shoulder 10/25/2014   S/P right rotator cuff repair    Advanced nonexudative age-related macular degeneration of right eye with subfoveal involvement 12/27/2019   Allergic reaction 02/07/2018   Allergic rhinoconjunctivitis 08/05/2017   Cerumen impaction 09/02/2012   Chest pain 04/24/2013   Complete rotator cuff tear 02/16/2013   On scan today she appears to have the anterior and superior capsule torn away with retraction    Degenerative joint disease of knee, left 08/04/2011   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 Minimal DJD changes on xray. Sunrise view wasn't done.     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 Not in range to require treatment    Intermediate stage nonexudative age-related macular degeneration of both eyes 05/03/2019   The nature of dry age related  macular degeneration was discussed with the patient as well as its possible conversion to wet. The results of the AREDS 2 study was discussed with the patient. A diet rich in dark leafy green vegetables was advised and specific recommendations were made regarding supplements with AREDS 2 formulation . Control of hypertension and serum cholesterol may slow the disease.   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 Minimal DJD changes on xray. Sunrise view wasn't done.     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 Magod MD [facility]) - diagnostic colonoscopy for hematochezia   Unsteady gait 09/02/2012   Valgus deformity of great toes, bilateral 08/28/2016   Past Surgical History:  Procedure Laterality Date   BREAST CYST ASPIRATION  1999   benign fibrocystic disease   COLONOSCOPY  09/2014   No polyps, Dr Teresa Shaffer.    COLONOSCOPY W/ BIOPSIES AND POLYPECTOMY  12/2003   tubular adenoma   ENDOMETRIAL BIOPSY     benign   ROTATOR CUFF REPAIR     Dr Teresa Shaffer (Ortho)   TONSILLECTOMY  1953   TYMPANOPLASTY WITH GRAFT Left 11/17/2016   Surgeon: Teresa Mutters MD (ENT)     FAMILY HISTORY Family History  Problem Relation Age of Onset   Neuropathy Father    Diabetes Father    Coronary artery disease Father    Neuropathy Brother    Neuropathy  Brother    Diabetes Brother     SOCIAL HISTORY Social History   Tobacco Use   Smoking status: Never   Smokeless tobacco: Never  Substance Use Topics   Alcohol use: Yes    Alcohol/week: 0.0 standard drinks    Comment: rare alcohol   Drug use: No         OPHTHALMIC EXAM:  Base Eye Exam     Visual Acuity (ETDRS)       Right Left   Dist cc CF at 3' 20/40 -2   Dist ph cc  NI         Tonometry (Tonopen, 9:39 AM)       Right Left   Pressure 11 13  Pupils       Pupils Dark Light Shape React APD   Right PERRL 4 3 Round Slow None   Left PERRL 4 3 Round Slow None         Visual Fields (Counting fingers)       Left Right    Full Full         Extraocular Movement       Right Left    Full Full         Neuro/Psych     Oriented x3: Yes   Mood/Affect: Normal         Dilation     Both eyes: 1.0% Mydriacyl, 2.5% Phenylephrine @ 9:39 AM           Slit Lamp and Fundus Exam     External Exam       Right Left   External Normal Normal         Slit Lamp Exam       Right Left   Lids/Lashes Normal Normal   Conjunctiva/Sclera White and quiet White and quiet   Cornea Clear Clear   Anterior Chamber Deep and quiet Deep and quiet   Iris Round and reactive Round and reactive   Lens 2.5+ Nuclear sclerosis 2.5+ Nuclear sclerosis   Anterior Vitreous Normal Normal         Fundus Exam       Right Left   Posterior Vitreous Posterior vitreous detachment Posterior vitreous detachment   Disc Normal Normal   C/D Ratio 0.25 0.3   Macula Disciform scar, Hard drusen, no exudates, Macular thickening, no hemorrhage, Retinal pigment epithelial mottling, Advanced age related macular degeneration, Choroidal nevus, Geographic atrophy Drusen, Soft drusen, Intermediate age related macular degeneration, no membrane, no hemorrhage, , Retinal pigment epithelial mottling   Vessels Normal Normal   Periphery Normal Normal            IMAGING AND  PROCEDURES  Imaging and Procedures for 07/30/20  OCT, Retina - OU - Both Eyes       Right Eye Quality was good. Scan locations included subfoveal. Central Foveal Thickness: 293. Progression has improved. Findings include subretinal scarring, disciform scar, abnormal foveal contour, intraretinal fluid, subretinal fluid.   Left Eye Quality was good. Scan locations included subfoveal. Central Foveal Thickness: 258. Progression has been stable. Findings include abnormal foveal contour, retinal drusen , no IRF, no SRF.   Notes No signs of CNVM left eye  OD vastly improved macular thickness as compared to onset of disease December 2019 as well as no active CNVM at 28-monthinterval.     Intravitreal Injection, Pharmacologic Agent - OD - Right Eye       Time Out 07/30/2020. 9:51 AM. Confirmed correct patient, procedure, site, and patient consented.   Anesthesia Topical anesthesia was used. Anesthetic medications included Akten 3.5%.   Procedure Preparation included Tobramycin 0.3%, 10% betadine to eyelids, 5% betadine to ocular surface. A 30 gauge needle was used.   Injection: 2 mg aflibercept 2 MG/0.05ML   Route: Intravitreal, Site: Right Eye   NDC: [id number] Lot: [id number] Waste: 0 mL   Post-op Post injection exam found visual acuity of at least counting fingers. The patient tolerated the procedure well. There were no complications. The patient received written and verbal post procedure care education. Post injection medications were not given.              ASSESSMENT/PLAN:  Nuclear sclerotic cataract of left eye Patient beginning to  have visual difficulty with activities of daily living.  Likely from the left eye variable from cataract.  I have asked patient to follow-up with Dr. Sharyne Shaffer as scheduled  She may consider having cataract surgery in his left eye, after the right eye, so as to maximize her visual potential for the rest of life  Nuclear sclerotic  cataract of right eye I suggest the patient consider cataract surgery in the right eye as a precursor to performance of similar in her monocular best eye, the left  Intermediate stage nonexudative age-related macular degeneration of left eye Significant subfoveal and subretinal drusen deposits yet no sign of CNVM  Posterior vitreous detachment of both eyes Physiologic, no retinal holes or tears  Exudative age-related macular degeneration of right eye with active choroidal neovascularization ([place]) Now at 63-monthfollow-up interval to maintain and prevent scotoma growth.  Some evidence of intraretinal fluid and subretinal fluid OD today at 4 months.  Repeat Eylea today and follow-up in 3 months OD     ICD-10-CM   1. Exudative age-related macular degeneration of right eye with active choroidal neovascularization (HCC)  H35.3211 OCT, Retina - OU - Both Eyes    Intravitreal Injection, Pharmacologic Agent - OD - Right Eye    aflibercept (EYLEA) SOLN 2 mg    2. Nuclear sclerotic cataract of left eye  H25.12     3. Nuclear sclerotic cataract of right eye  H25.11     4. Intermediate stage nonexudative age-related macular degeneration of left eye  H35.3122     5. Posterior vitreous detachment of both eyes  H43.813       1.  OD repeat injection Eylea today to maintain and prevent scotoma growth OD.  Some activity is present at 455-monthollow-up.  We will repeat today and shorten interval examination right eye to 3 months  2.  Follow-up with Dr. SiTheda Sersor consideration of cataract extraction, if so, right eye first  3.  Ophthalmic Meds Ordered this visit:  Meds ordered this encounter  Medications   aflibercept (EYLEA) SOLN 2 mg       Return in about 3 months (around 10/30/2020) for DILATE OU, EYLEA OCT, OD.  There are no Patient Instructions on file for this visit.   Explained the diagnoses, plan, and follow up with the patient and they expressed understanding.  Patient  expressed understanding of the importance of proper follow up care.   GaClent Demarkankin M.D. Diseases & Surgery of the Retina and Vitreous [facility] [place]7/26/22     Abbreviations: M myopia (nearsighted); A astigmatism; H hyperopia (farsighted); P presbyopia; Mrx spectacle prescription;  CTL contact lenses; OD right eye; OS left eye; OU both eyes  XT exotropia; ET esotropia; PEK punctate epithelial keratitis; PEE punctate epithelial erosions; DES dry eye syndrome; MGD meibomian gland dysfunction; ATs artificial tears; PFAT's preservative free artificial tears; NSChataignieruclear sclerotic cataract; PSC posterior subcapsular cataract; ERM epi-retinal membrane; PVD posterior vitreous detachment; RD retinal detachment; DM diabetes mellitus; DR diabetic retinopathy; NPDR non-proliferative diabetic retinopathy; PDR proliferative diabetic retinopathy; CSME clinically significant macular edema; DME diabetic macular edema; dbh dot blot hemorrhages; CWS cotton wool spot; POAG primary open angle glaucoma; C/D cup-to-disc ratio; HVF humphrey visual field; GVF goldmann visual field; OCT optical coherence tomography; IOP intraocular pressure; BRVO Branch retinal vein occlusion; CRVO central retinal vein occlusion; CRAO central retinal artery occlusion; BRAO branch retinal artery occlusion; RT retinal tear; SB scleral buckle; PPV pars plana vitrectomy; VH Vitreous  hemorrhage; PRP panretinal laser photocoagulation; IVK intravitreal kenalog; VMT vitreomacular traction; MH Macular hole;  NVD neovascularization of the disc; NVE neovascularization elsewhere; AREDS age related eye disease study; ARMD age related macular degeneration; POAG primary open angle glaucoma; EBMD epithelial/anterior basement membrane dystrophy; ACIOL anterior chamber intraocular lens; IOL intraocular lens; PCIOL posterior chamber intraocular lens; Phaco/IOL phacoemulsification with intraocular lens placement; [place] photorefractive keratectomy;  LASIK laser assisted in situ keratomileusis; HTN hypertension; DM diabetes mellitus; COPD chronic obstructive pulmonary disease

## 2020-07-30 NOTE — Assessment & Plan Note (Signed)
Significant subfoveal and subretinal drusen deposits yet no sign of CNVM

## 2020-07-30 NOTE — Assessment & Plan Note (Signed)
I suggest the patient consider cataract surgery in the right eye as a precursor to performance of similar in her monocular best eye, the left

## 2020-07-30 NOTE — Assessment & Plan Note (Signed)
Now at 69-monthfollow-up interval to maintain and prevent scotoma growth.  Some evidence of intraretinal fluid and subretinal fluid OD today at 4 months.  Repeat Eylea today and follow-up in 3 months OD

## 2020-07-30 NOTE — Assessment & Plan Note (Signed)
Physiologic, no retinal holes or tears 

## 2020-07-31 ENCOUNTER — Encounter (HOSPITAL_COMMUNITY)
Admission: RE | Admit: 2020-07-31 | Discharge: 2020-07-31 | Disposition: A | Discharge status: Home / Self Care | Payer: Medicare PPO | Source: Ambulatory Visit | Attending: Orthopedic Surgery | Admitting: Orthopedic Surgery

## 2020-07-31 ENCOUNTER — Encounter (HOSPITAL_COMMUNITY): Payer: Self-pay

## 2020-07-31 ENCOUNTER — Other Ambulatory Visit: Payer: Self-pay

## 2020-07-31 DIAGNOSIS — Z01812 Encounter for preprocedural laboratory examination: Secondary | ICD-10-CM | POA: Insufficient documentation

## 2020-07-31 HISTORY — DX: Myoneural disorder, unspecified: G70.9

## 2020-07-31 HISTORY — DX: Prediabetes: R73.03

## 2020-07-31 LAB — SURGICAL PCR SCREEN
MRSA, PCR: NEGATIVE
Staphylococcus aureus: NEGATIVE

## 2020-07-31 LAB — CBC
HCT: 41.7 % (ref 36.0–46.0)
Hemoglobin: 13.4 g/dL (ref 12.0–15.0)
MCH: 30.7 pg (ref 26.0–34.0)
MCHC: 32.1 g/dL (ref 30.0–36.0)
MCV: 95.6 fL (ref 80.0–100.0)
Platelets: 243 10*3/uL (ref 150–400)
RBC: 4.36 MIL/uL (ref 3.87–5.11)
RDW: 13.2 % (ref 11.5–15.5)
WBC: 7.6 10*3/uL (ref 4.0–10.5)
nRBC: 0 % (ref 0.0–0.2)

## 2020-08-01 ENCOUNTER — Other Ambulatory Visit (HOSPITAL_COMMUNITY): Payer: Self-pay

## 2020-08-01 DIAGNOSIS — H5202 Hypermetropia, left eye: Secondary | ICD-10-CM | POA: Diagnosis not present

## 2020-08-01 DIAGNOSIS — H524 Presbyopia: Secondary | ICD-10-CM | POA: Diagnosis not present

## 2020-08-01 DIAGNOSIS — H2513 Age-related nuclear cataract, bilateral: Secondary | ICD-10-CM | POA: Diagnosis not present

## 2020-08-01 DIAGNOSIS — H353113 Nonexudative age-related macular degeneration, right eye, advanced atrophic without subfoveal involvement: Secondary | ICD-10-CM | POA: Diagnosis not present

## 2020-08-01 LAB — HEMOGLOBIN A1C
Hgb A1c MFr Bld: 6.8 % — ABNORMAL HIGH (ref 4.8–5.6)
Mean Plasma Glucose: 148 mg/dL

## 2020-08-01 MED FILL — Gabapentin Tab 800 MG: ORAL | 30 days supply | Qty: 120 | Fill #2 | Status: AC

## 2020-08-02 ENCOUNTER — Other Ambulatory Visit (HOSPITAL_COMMUNITY): Payer: Self-pay

## 2020-08-02 MED ORDER — ONDANSETRON HCL 4 MG PO TABS
ORAL_TABLET | ORAL | 0 refills | Status: DC
  Filled 2020-08-02: qty 10, 3d supply, fill #0

## 2020-08-02 MED ORDER — SENNOSIDES-DOCUSATE SODIUM 8.6-50 MG PO TABS
ORAL_TABLET | ORAL | 0 refills | Status: DC
  Filled 2020-08-02: qty 30, 15d supply, fill #0

## 2020-08-02 MED ORDER — OXYCODONE HCL 5 MG PO TABS
ORAL_TABLET | ORAL | 0 refills | Status: DC
  Filled 2020-08-02: qty 30, 5d supply, fill #0

## 2020-08-05 ENCOUNTER — Other Ambulatory Visit (HOSPITAL_COMMUNITY): Payer: Self-pay

## 2020-08-05 NOTE — H&P (Signed)
SHOULDER ARTHROPLASTY ADMISSION H&P  Patient ID: LUCHANA TUDOR MRN: [id number] DOB/AGE: Sep 23, 1940 80 y.o.  Chief Complaint: left shoulder pain.  Planned Procedure Date: 08/06/20 Medical Clearance by Dr. McDiarmid     HPI: Teresa Shaffer is a 80 y.o. female who presents for evaluation of djd left shoulder. The patient has a history of pain and functional disability in the left shoulder due to arthritis and has failed non-surgical conservative treatments for greater than 12 weeks to include NSAID's and/or analgesics and activity modification.  Onset of symptoms was gradual, starting 2 years ago with gradually worsening course since that time. The patient noted no past surgery on the left shoulder.  Patient currently rates pain at 1 out of 10 with activity. Patient has night pain and pain that interferes with activities of daily living.  Patient has evidence of joint space narrowing by imaging studies.  There is no active infection.  Past Medical History:  Diagnosis Date   Adhesive capsulitis of right shoulder 10/25/2014   S/P right rotator cuff repair    Advanced nonexudative age-related macular degeneration of right eye with subfoveal involvement 12/27/2019   Allergic reaction 02/07/2018   Allergic rhinoconjunctivitis 08/05/2017   Cerumen impaction 09/02/2012   Chest pain 04/24/2013   Complete rotator cuff tear 02/16/2013   On scan today she appears to have the anterior and superior capsule torn away with retraction    Degenerative joint disease of knee, left 08/04/2011   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 Minimal DJD changes on xray. Sunrise view wasn't done.     Degenerative retinal drusen of left eye 05/03/2019   DNR (do not resuscitate) 12/18/2014   Mrs Teresa Shaffer has an authorized Transport DNR (Yellow DNR) document dated 12/06/14   FIBROADENOSIS, BREAST 03/04/2006   Qualifier: Diagnosis of  By: Eusebio Friendly     Fibrocystic breast changes, bilateral 08/28/2016   Has  had cyst aspiration in past   Frequent stools 08/06/2017   Frozen shoulder 10/25/2014   Herpes simplex type 2 infection 08/20/2011   Hypertriglyceridemia 08/20/2011   TG 265 on 08/2011. Not in range to require treatment    Intermediate stage nonexudative age-related macular degeneration of both eyes 05/03/2019   The nature of dry age related macular degeneration was discussed with the patient as well as its possible conversion to wet. The results of the AREDS 2 study was discussed with the patient. A diet rich in dark leafy green vegetables was advised and specific recommendations were made regarding supplements with AREDS 2 formulation . Control of hypertension and serum cholesterol may slow the disease.   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 Minimal DJD changes on xray. Sunrise view wasn't done.     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 Magod MD [facility]) - diagnostic colonoscopy for hematochezia   Unsteady gait 09/02/2012   Valgus deformity of great toes, bilateral 08/28/2016   Past Surgical History:  Procedure Laterality Date   BREAST CYST ASPIRATION  1999   benign fibrocystic disease   COLONOSCOPY  09/2014   No polyps, Dr Cleaster Corin.     COLONOSCOPY W/ BIOPSIES AND POLYPECTOMY  12/2003   tubular adenoma   ENDOMETRIAL BIOPSY     benign   ROTATOR CUFF REPAIR Right    Dr Mardelle Matte (Ortho)   [place] WITH GRAFT Left 11/17/2016   Surgeon: Vicie Mutters MD (ENT)    No Known Allergies Prior to Admission medications   Medication Sig Start Date End Date Taking? Authorizing Provider  Aflibercept (EYLEA) 2 MG/0.05ML SOLN by Intravitreal route every 3 (three) months. 09/07/19  Yes McDiarmid, Blane Ohara, MD  Calcium Carbonate-Vitamin D 600-400 MG-UNIT tablet Take 1 tablet by mouth daily.   Yes [provider]  cetirizine (ZYRTEC) 10 MG tablet Take 10 mg by mouth daily. 08/06/17  Yes McDiarmid, Blane Ohara, MD  flunisolide (NASALIDE) 25 MCG/ACT (0.025%) SOLN PLACE 2 SPRAYS INTO EACH NOSTRIL 2 TIMES DAILY AS NEEDED Patient taking differently: Place 2 sprays into the nose in the morning and at bedtime. 05/03/20 05/03/21 Yes McDiarmid, Blane Ohara, MD  gabapentin (NEURONTIN) 800 MG tablet TAKE 1 TABLET BY MOUTH 4 TIMES DAILY Patient taking differently: Take 800 mg by mouth 4 (four) times daily. 01/18/20 01/17/21 Yes McDiarmid, Blane Ohara, MD  hydrocortisone cream 1 % Apply 1 application topically daily. Apply to face   Yes [provider]  magnesium oxide (MAG-OX) 400 MG tablet Take 400 mg by mouth daily.   Yes [provider]  Multiple Vitamin (MULTIVITAMINS PO) Take 1 tablet by mouth daily.   Yes [provider]  Multiple Vitamins-Minerals (PRESERVISION AREDS) TABS Take 1 capsule by mouth in the morning and at bedtime. 08/06/17  Yes McDiarmid, Blane Ohara, MD  ondansetron (ZOFRAN) 4 MG tablet Take 1 tablet by mouth every eight hours as needed for nausea and vomiting 08/02/20     oxyCODONE (OXY IR/ROXICODONE) 5 MG immediate release tablet Take 1 tablet by mouth every four hours as needed for pain 08/02/20     polyethylene glycol-electrolytes (NULYTELY) 420 g solution USE AS DIRECTED 11/23/19 11/22/20  Clarene Essex,  MD  senna-docusate (SENOKOT S) 8.6-50 MG tablet Take 2 tablet by mouth once a day when taking pain medication 08/02/20      Social History   Socioeconomic History   Marital status: Married    Spouse name: Eulas Post   Number of children: 1   Years of education: 16   Highest education level: Not on file  Occupational History   Occupation: Retired    Fish farm manager: [employer]  Tobacco Use   Smoking status: Never   Smokeless tobacco: Never  Vaping Use   Vaping Use: Never used  Substance and Sexual Activity   Alcohol use: Not Currently    Comment: rare alcohol   Drug use: No   Sexual activity: Not Currently    Partners: Male  Other Topics Concern   Not on file  Social History Narrative   Married to [place], second marriage   Son, Leighton, by first marriage, born 1966   Retired Education officer, museum      Health Care POA:    Emergency Contact: husband,  Eulas Post, [phone]   End of Life Plan: Mrs Amari Hsueh has an authorized Transport DNR (Yellow DNR) document dated 12/06/14   Who lives with you: husband   Any pets: none   Diet: Pt has a varied diet of protein, starch, and vegetables   Exercise: Pt has no regular exercise routine.   Seatbelts: Pt reports wearing seatbelt when in vehicles.    Sun Exposure/Protection: Pt reports using sun protection regularly.   Hobbies: reading, gardneing, sewing, volunteering at [employer], sub. teaching            Social Determinants of Health   Financial Resource Strain: Not on file  Food Insecurity: Not on file  Transportation Needs: Not on file  Physical Activity: Not on file  Stress: Not on file  Social Connections: Not on file   Family History  Problem Relation Age of Onset   Neuropathy Father    Diabetes Father    Coronary artery disease Father    Neuropathy Brother    Neuropathy Brother    Diabetes Brother     ROS: Currently denies lightheadedness, dizziness, Fever, chills, CP, SOB.   No personal history of DVT, PE, MI, or  CVA. No loose teeth or dentures All other systems have been reviewed and were otherwise currently negative with the exception of those mentioned in the HPI and as above.  Objective: Vitals: Ht: '5\' 1"'$  Wt: 176 lbs Temp: 97.8 BP: 136/78 Pulse: 75 O2 95% on room air.   Physical Exam: General: Alert, NAD.   HEENT: EOMI, Good Neck Extension  Pulm: No increased work of breathing.  Clear B/L A/P w/o crackle or wheeze.  CV: RRR, No m/g/r appreciated  GI: soft, NT, ND Neuro: Neuro without gross focal deficit.  Sensation intact distally Skin: No lesions in the area of chief complaint MSK/Surgical Site: left shoulder pain with range of motion.  Forward flexion/abduction approximately 0-150.  Internal rotation to left lateral thigh.  External rotation 5 deg.  No AC pain.  No Biceps pain.  5/5 Rotator cuff strength.  NVI distally.  Imaging Review Plain radiographs demonstrate severe degenerative joint disease of the left shoulder.  Pre-operative CT scan shows severe shoulder degenerative changes with intact rotator cuff.  Assessment: Left shoulder osteoarthritis   Plan: Plan for Procedure(s): TOTAL SHOULDER ARTHROPLASTY  The patient history, physical exam, clinical judgement of the provider and imaging are consistent with end stage degenerative joint disease and total joint arthroplasty is deemed medically necessary. The treatment options including medical management, injection therapy, and arthroplasty were discussed at length. The risks and benefits of Procedure(s): TOTAL SHOULDER ARTHROPLASTY were presented and reviewed.  The risks of nonoperative treatment, versus surgical intervention including but not limited to continued pain, aseptic loosening, stiffness, dislocation/subluxation, infection, bleeding, nerve injury, blood clots, cardiopulmonary complications, morbidity, mortality, among others were discussed. The patient verbalizes understanding and wishes to proceed with the plan.  Patient is  being admitted for surgery, OT, pain control, prophylactic antibiotics, VTE prophylaxis, progressive ambulation, ADL's and discharge planning.   Dental prophylaxis discussed and recommended for 2 years postoperatively.  The patient does meet the criteria for TXA which will be used perioperatively.   The patient is planning to be discharged home care of her husband.    Ventura Bruns, PA-C 08/05/2020 1:53 PM

## 2020-08-06 ENCOUNTER — Ambulatory Visit (HOSPITAL_COMMUNITY): Payer: Medicare PPO

## 2020-08-06 ENCOUNTER — Encounter (HOSPITAL_COMMUNITY): Payer: Self-pay | Admitting: Orthopedic Surgery

## 2020-08-06 ENCOUNTER — Ambulatory Visit (HOSPITAL_COMMUNITY): Payer: Medicare PPO | Admitting: Certified Registered"

## 2020-08-06 ENCOUNTER — Ambulatory Visit (HOSPITAL_COMMUNITY)
Admission: RE | Admit: 2020-08-06 | Discharge: 2020-08-06 | Disposition: A | Discharge status: Home / Self Care | Payer: Medicare PPO | Attending: Orthopedic Surgery | Admitting: Orthopedic Surgery

## 2020-08-06 ENCOUNTER — Encounter (HOSPITAL_COMMUNITY)
Admission: RE | Disposition: A | Discharge status: Home / Self Care | Payer: Self-pay | Source: Home / Self Care | Attending: Orthopedic Surgery

## 2020-08-06 DIAGNOSIS — M75122 Complete rotator cuff tear or rupture of left shoulder, not specified as traumatic: Secondary | ICD-10-CM | POA: Insufficient documentation

## 2020-08-06 DIAGNOSIS — M19012 Primary osteoarthritis, left shoulder: Secondary | ICD-10-CM | POA: Insufficient documentation

## 2020-08-06 DIAGNOSIS — Z01818 Encounter for other preprocedural examination: Secondary | ICD-10-CM | POA: Diagnosis not present

## 2020-08-06 DIAGNOSIS — Z79899 Other long term (current) drug therapy: Secondary | ICD-10-CM | POA: Diagnosis not present

## 2020-08-06 DIAGNOSIS — R600 Localized edema: Secondary | ICD-10-CM | POA: Diagnosis not present

## 2020-08-06 DIAGNOSIS — M12812 Other specific arthropathies, not elsewhere classified, left shoulder: Secondary | ICD-10-CM | POA: Diagnosis present

## 2020-08-06 DIAGNOSIS — G8918 Other acute postprocedural pain: Secondary | ICD-10-CM | POA: Diagnosis not present

## 2020-08-06 DIAGNOSIS — Z96612 Presence of left artificial shoulder joint: Secondary | ICD-10-CM

## 2020-08-06 DIAGNOSIS — J302 Other seasonal allergic rhinitis: Secondary | ICD-10-CM | POA: Diagnosis not present

## 2020-08-06 DIAGNOSIS — M75102 Unspecified rotator cuff tear or rupture of left shoulder, not specified as traumatic: Secondary | ICD-10-CM | POA: Diagnosis not present

## 2020-08-06 DIAGNOSIS — Z471 Aftercare following joint replacement surgery: Secondary | ICD-10-CM | POA: Diagnosis not present

## 2020-08-06 DIAGNOSIS — Z9889 Other specified postprocedural states: Secondary | ICD-10-CM | POA: Diagnosis not present

## 2020-08-06 HISTORY — PX: TOTAL SHOULDER ARTHROPLASTY: SHX126

## 2020-08-06 SURGERY — ARTHROPLASTY, SHOULDER, TOTAL
Anesthesia: General | Site: Shoulder | Laterality: Left

## 2020-08-06 MED ORDER — PROPOFOL 10 MG/ML IV BOLUS
INTRAVENOUS | Status: DC | PRN
  Administered 2020-08-06: 90 mg via INTRAVENOUS

## 2020-08-06 MED ORDER — FENTANYL CITRATE (PF) 100 MCG/2ML IJ SOLN: 25.0000 ug | INTRAMUSCULAR | Status: DC | PRN

## 2020-08-06 MED ORDER — METHOCARBAMOL 500 MG IVPB - SIMPLE MED: 500.0000 mg | Freq: Four times a day (QID) | INTRAVENOUS | Status: DC | PRN

## 2020-08-06 MED ORDER — FENTANYL CITRATE (PF) 100 MCG/2ML IJ SOLN
50.0000 ug | Freq: Once | INTRAMUSCULAR | Status: AC
  Administered 2020-08-06: 50 ug via INTRAVENOUS
  Filled 2020-08-06: qty 2

## 2020-08-06 MED ORDER — TRANEXAMIC ACID-NACL 1000-0.7 MG/100ML-% IV SOLN
1000.0000 mg | INTRAVENOUS | Status: AC
  Administered 2020-08-06: 1000 mg via INTRAVENOUS
  Filled 2020-08-06: qty 100

## 2020-08-06 MED ORDER — SODIUM CHLORIDE 0.9 % IR SOLN
Status: DC | PRN
  Administered 2020-08-06: 1000 mL

## 2020-08-06 MED ORDER — PROPOFOL 10 MG/ML IV BOLUS
INTRAVENOUS | Status: AC
  Filled 2020-08-06: qty 20

## 2020-08-06 MED ORDER — LACTATED RINGERS IV BOLUS: 250.0000 mL | Freq: Once | INTRAVENOUS | Status: DC

## 2020-08-06 MED ORDER — ORAL CARE MOUTH RINSE: 15.0000 mL | Freq: Once | OROMUCOSAL | Status: AC

## 2020-08-06 MED ORDER — OXYCODONE HCL 5 MG PO TABS
ORAL_TABLET | ORAL | Status: AC
  Filled 2020-08-06: qty 1

## 2020-08-06 MED ORDER — ACETAMINOPHEN 500 MG PO TABS
1000.0000 mg | ORAL_TABLET | Freq: Once | ORAL | Status: AC
  Administered 2020-08-06: 1000 mg via ORAL
  Filled 2020-08-06: qty 2

## 2020-08-06 MED ORDER — ROCURONIUM BROMIDE 10 MG/ML (PF) SYRINGE
PREFILLED_SYRINGE | INTRAVENOUS | Status: DC | PRN
  Administered 2020-08-06: 50 mg via INTRAVENOUS

## 2020-08-06 MED ORDER — LIDOCAINE 2% (20 MG/ML) 5 ML SYRINGE
INTRAMUSCULAR | Status: AC
  Filled 2020-08-06: qty 5

## 2020-08-06 MED ORDER — DEXAMETHASONE SODIUM PHOSPHATE 10 MG/ML IJ SOLN
INTRAMUSCULAR | Status: DC | PRN
  Administered 2020-08-06: 4 mg via INTRAVENOUS

## 2020-08-06 MED ORDER — SUGAMMADEX SODIUM 200 MG/2ML IV SOLN
INTRAVENOUS | Status: DC | PRN
  Administered 2020-08-06: 160 mg via INTRAVENOUS

## 2020-08-06 MED ORDER — METHOCARBAMOL 500 MG PO TABS
500.0000 mg | ORAL_TABLET | Freq: Four times a day (QID) | ORAL | Status: DC | PRN
  Administered 2020-08-06: 500 mg via ORAL

## 2020-08-06 MED ORDER — PHENYLEPHRINE HCL (PRESSORS) 10 MG/ML IV SOLN
INTRAVENOUS | Status: AC
  Filled 2020-08-06: qty 1

## 2020-08-06 MED ORDER — LACTATED RINGERS IV BOLUS
500.0000 mL | Freq: Once | INTRAVENOUS | Status: AC
  Administered 2020-08-06: 500 mL via INTRAVENOUS

## 2020-08-06 MED ORDER — STERILE WATER FOR IRRIGATION IR SOLN
Status: DC | PRN
  Administered 2020-08-06: 1000 mL

## 2020-08-06 MED ORDER — 0.9 % SODIUM CHLORIDE (POUR BTL) OPTIME
TOPICAL | Status: DC | PRN
  Administered 2020-08-06: 1000 mL

## 2020-08-06 MED ORDER — TRANEXAMIC ACID-NACL 1000-0.7 MG/100ML-% IV SOLN: 1000.0000 mg | Freq: Once | INTRAVENOUS | Status: DC

## 2020-08-06 MED ORDER — LACTATED RINGERS IV SOLN: INTRAVENOUS | Status: DC

## 2020-08-06 MED ORDER — FENTANYL CITRATE (PF) 100 MCG/2ML IJ SOLN
INTRAMUSCULAR | Status: AC
  Filled 2020-08-06: qty 2

## 2020-08-06 MED ORDER — FENTANYL CITRATE (PF) 250 MCG/5ML IJ SOLN
INTRAMUSCULAR | Status: DC | PRN
  Administered 2020-08-06: 50 ug via INTRAVENOUS

## 2020-08-06 MED ORDER — LIDOCAINE 2% (20 MG/ML) 5 ML SYRINGE
INTRAMUSCULAR | Status: DC | PRN
  Administered 2020-08-06: 40 mg via INTRAVENOUS

## 2020-08-06 MED ORDER — EPHEDRINE SULFATE-NACL 50-0.9 MG/10ML-% IV SOSY
PREFILLED_SYRINGE | INTRAVENOUS | Status: DC | PRN
  Administered 2020-08-06: 10 mg via INTRAVENOUS

## 2020-08-06 MED ORDER — MIDAZOLAM HCL 2 MG/2ML IJ SOLN
1.0000 mg | Freq: Once | INTRAMUSCULAR | Status: DC
  Filled 2020-08-06: qty 2

## 2020-08-06 MED ORDER — BUPIVACAINE-EPINEPHRINE (PF) 0.5% -1:200000 IJ SOLN
INTRAMUSCULAR | Status: DC | PRN
  Administered 2020-08-06: 15 mL via PERINEURAL

## 2020-08-06 MED ORDER — ONDANSETRON HCL 4 MG/2ML IJ SOLN
INTRAMUSCULAR | Status: AC
  Filled 2020-08-06: qty 2

## 2020-08-06 MED ORDER — OXYCODONE HCL 5 MG PO TABS
5.0000 mg | ORAL_TABLET | Freq: Once | ORAL | Status: AC
  Administered 2020-08-06: 5 mg via ORAL

## 2020-08-06 MED ORDER — CEFAZOLIN SODIUM-DEXTROSE 1-4 GM/50ML-% IV SOLN: 1.0000 g | Freq: Four times a day (QID) | INTRAVENOUS | Status: DC

## 2020-08-06 MED ORDER — METHOCARBAMOL 500 MG PO TABS
ORAL_TABLET | ORAL | Status: AC
  Filled 2020-08-06: qty 1

## 2020-08-06 MED ORDER — PHENYLEPHRINE HCL-NACL 20-0.9 MG/250ML-% IV SOLN
INTRAVENOUS | Status: DC | PRN
  Administered 2020-08-06: 20 ug/min via INTRAVENOUS

## 2020-08-06 MED ORDER — CEFAZOLIN SODIUM-DEXTROSE 2-4 GM/100ML-% IV SOLN
2.0000 g | INTRAVENOUS | Status: AC
  Administered 2020-08-06: 2 g via INTRAVENOUS
  Filled 2020-08-06: qty 100

## 2020-08-06 MED ORDER — BUPIVACAINE LIPOSOME 1.3 % IJ SUSP
INTRAMUSCULAR | Status: DC | PRN
  Administered 2020-08-06: 10 mL via PERINEURAL

## 2020-08-06 MED ORDER — DEXAMETHASONE SODIUM PHOSPHATE 10 MG/ML IJ SOLN
INTRAMUSCULAR | Status: AC
  Filled 2020-08-06: qty 1

## 2020-08-06 MED ORDER — ONDANSETRON HCL 4 MG/2ML IJ SOLN
INTRAMUSCULAR | Status: DC | PRN
  Administered 2020-08-06: 4 mg via INTRAVENOUS

## 2020-08-06 MED ORDER — CHLORHEXIDINE GLUCONATE 0.12 % MT SOLN
15.0000 mL | Freq: Once | OROMUCOSAL | Status: AC
  Administered 2020-08-06: 15 mL via OROMUCOSAL

## 2020-08-06 MED ORDER — LACTATED RINGERS IV BOLUS
250.0000 mL | Freq: Once | INTRAVENOUS | Status: AC
  Administered 2020-08-06: 250 mL via INTRAVENOUS

## 2020-08-06 MED ORDER — ROCURONIUM BROMIDE 10 MG/ML (PF) SYRINGE
PREFILLED_SYRINGE | INTRAVENOUS | Status: AC
  Filled 2020-08-06: qty 10

## 2020-08-06 MED ORDER — POVIDONE-IODINE 10 % EX SWAB
2.0000 "application " | Freq: Once | CUTANEOUS | Status: AC
  Administered 2020-08-06: 2 via TOPICAL

## 2020-08-06 SURGICAL SUPPLY — 66 items
AID PSTN UNV HD RSTRNT DISP (MISCELLANEOUS) ×1
BAG COUNTER SPONGE SURGICOUNT (BAG) IMPLANT
BAG SPEC THK2 15X12 ZIP CLS (MISCELLANEOUS) ×1
BAG SPNG CNTER NS LX DISP (BAG)
BAG ZIPLOCK 12X15 (MISCELLANEOUS) ×2 IMPLANT
BASEPLATE GLENOSPHERE 25 (Plate) ×1 IMPLANT
BEARING HUMERAL SHLDER 36M STD (Shoulder) IMPLANT
BIT DRILL QUICK REL 1/8 2PK SL (DRILL) IMPLANT
BIT DRILL TWIST 2.7 (BIT) ×1 IMPLANT
BLADE SAW SGTL 18X1.27X75 (BLADE) ×2 IMPLANT
BRNG HUM STD 36 RVRS SHLDR (Shoulder) ×1 IMPLANT
CLSR STERI-STRIP ANTIMIC 1/2X4 (GAUZE/BANDAGES/DRESSINGS) ×2 IMPLANT
COOLER ICEMAN CLASSIC (MISCELLANEOUS) ×2 IMPLANT
COVER BACK TABLE 60X90IN (DRAPES) ×2 IMPLANT
COVER MAYO STAND STRL (DRAPES) ×2 IMPLANT
COVER SURGICAL LIGHT HANDLE (MISCELLANEOUS) ×2 IMPLANT
DECANTER SPIKE VIAL GLASS SM (MISCELLANEOUS) IMPLANT
DRAPE POUCH INSTRU U-SHP 10X18 (DRAPES) ×2 IMPLANT
DRAPE SHEET LG 3/4 BI-LAMINATE (DRAPES) ×2 IMPLANT
DRAPE SURG 17X11 SM STRL (DRAPES) ×2 IMPLANT
DRAPE U-SHAPE 47X51 STRL (DRAPES) ×2 IMPLANT
DRILL QUICK RELEASE 1/8 INCH (DRILL) ×2
DRSG MEPILEX BORDER 4X8 (GAUZE/BANDAGES/DRESSINGS) ×2 IMPLANT
DURAPREP 26ML APPLICATOR (WOUND CARE) ×2 IMPLANT
ELECT REM PT RETURN 15FT ADLT (MISCELLANEOUS) ×2 IMPLANT
GLENOID SPHERE STD STRL 36MM (Orthopedic Implant) ×1 IMPLANT
GLOVE SRG 8 PF TXTR STRL LF DI (GLOVE) ×1 IMPLANT
GLOVE SURG ENC MOIS LTX SZ6.5 (GLOVE) ×2 IMPLANT
GLOVE SURG ENC MOIS LTX SZ7.5 (GLOVE) ×2 IMPLANT
GLOVE SURG UNDER POLY LF SZ7 (GLOVE) ×2 IMPLANT
GLOVE SURG UNDER POLY LF SZ8 (GLOVE) ×2
GOWN STRL REUS W/TWL LRG LVL3 (GOWN DISPOSABLE) ×4 IMPLANT
HANDPIECE INTERPULSE COAX TIP (DISPOSABLE) ×2
HOOD PEEL AWAY FLYTE STAYCOOL (MISCELLANEOUS) ×6 IMPLANT
KIT BASIN OR (CUSTOM PROCEDURE TRAY) ×2 IMPLANT
KIT TURNOVER KIT A (KITS) ×2 IMPLANT
NS IRRIG 1000ML POUR BTL (IV SOLUTION) ×2 IMPLANT
PACK SHOULDER (CUSTOM PROCEDURE TRAY) ×2 IMPLANT
PAD COLD SHLDR WRAP-ON (PAD) ×2 IMPLANT
PENCIL SMOKE EVACUATOR (MISCELLANEOUS) IMPLANT
PIN HUMERAL STMN 3.2MMX9IN (INSTRUMENTS) ×1 IMPLANT
PROTECTOR NERVE ULNAR (MISCELLANEOUS) ×2 IMPLANT
RESTRAINT HEAD UNIVERSAL NS (MISCELLANEOUS) ×2 IMPLANT
SCREW BONE STRL 6.5MMX25MM (Screw) ×1 IMPLANT
SCREW LOCKING 4.75MMX15MM (Screw) ×2 IMPLANT
SCREW LOCKING NS 4.75MMX20MM (Screw) ×1 IMPLANT
SCREW LOCKING STRL 4.75X25X3.5 (Screw) ×1 IMPLANT
SET HNDPC FAN SPRY TIP SCT (DISPOSABLE) ×1 IMPLANT
SHOULDER HUMERAL BEAR 36M STD (Shoulder) ×2 IMPLANT
SLING ARM IMMOBILIZER LRG (SOFTGOODS) ×2 IMPLANT
SMARTMIX MINI TOWER (MISCELLANEOUS) ×2
STEM HUMERAL STRL 8MMX55MM (Stem) ×1 IMPLANT
SUCTION FRAZIER HANDLE 12FR (TUBING) ×2
SUCTION TUBE FRAZIER 12FR DISP (TUBING) ×1 IMPLANT
SUPPORT WRAP ARM LG (MISCELLANEOUS) ×2 IMPLANT
SUT MAXBRAID #2 CVD NDL (SUTURE) ×2 IMPLANT
SUT MAXBRAID #5 CCS-NDL 2PK (SUTURE) ×4 IMPLANT
SUT VIC AB 1 CT1 36 (SUTURE) ×2 IMPLANT
SUT VIC AB 2-0 CT1 27 (SUTURE) ×2
SUT VIC AB 2-0 CT1 TAPERPNT 27 (SUTURE) ×1 IMPLANT
SUT VIC AB 3-0 SH 8-18 (SUTURE) ×2 IMPLANT
TOWEL OR 17X26 10 PK STRL BLUE (TOWEL DISPOSABLE) ×2 IMPLANT
TOWEL OR NON WOVEN STRL DISP B (DISPOSABLE) ×2 IMPLANT
TOWER SMARTMIX MINI (MISCELLANEOUS) ×1 IMPLANT
TRAY HUM MINI SHOULDER +3 40 (Joint) ×1 IMPLANT
WATER STERILE IRR 1000ML POUR (IV SOLUTION) ×4 IMPLANT

## 2020-08-06 NOTE — Transfer of Care (Signed)
Immediate Anesthesia Transfer of Care Note  Patient: QUANTERIA TEICHMAN  Procedure(s) Performed: TOTAL SHOULDER ARTHROPLASTY (Left: Shoulder)  Patient Location: PACU  Anesthesia Type:General  Level of Consciousness: awake, alert  and patient cooperative  Airway & Oxygen Therapy: Patient Spontanous Breathing and Patient connected to face mask oxygen  Post-op Assessment: Report given to RN and Post -op Vital signs reviewed and stable  Post vital signs: Reviewed and stable  Last Vitals:  Vitals Value Taken Time  BP    Temp    Pulse    Resp    SpO2      Last Pain:  Vitals:   08/06/20 1036  TempSrc:   PainSc: 0-No pain         Complications: No notable events documented.

## 2020-08-06 NOTE — Anesthesia Procedure Notes (Signed)
Procedure Name: Intubation Date/Time: 08/06/2020 11:34 AM Performed by: Eben Burow, CRNA Pre-anesthesia Checklist: Patient identified, Emergency Drugs available, Suction available, Patient being monitored and Timeout performed Patient Re-evaluated:Patient Re-evaluated prior to induction Oxygen Delivery Method: Circle system utilized Preoxygenation: Pre-oxygenation with 100% oxygen Induction Type: IV induction Ventilation: Mask ventilation without difficulty Laryngoscope Size: Mac and 4 Grade View: Grade I Tube type: Oral Tube size: 7.0 mm Number of attempts: 1 Airway Equipment and Method: Stylet Placement Confirmation: ETT inserted through vocal cords under direct vision, positive ETCO2 and breath sounds checked- equal and bilateral Secured at: 19 cm Tube secured with: Tape Dental Injury: Teeth and Oropharynx as per pre-operative assessment

## 2020-08-06 NOTE — Evaluation (Signed)
Occupational Therapy Evaluation Patient Details Name: Teresa Shaffer MRN: [id number] DOB: Aug 04, 1940 Today's Date: 08/06/2020    History of Present Illness patient is a 80 year old female who presented to the hospital on 8/2 for operative treatment of DJD of left shoulder. patient underwent a total shoulder replacement. PMH: R rotator cuff tear   Clinical Impression   Patient is a 80 year old female who was admitted for above. s/p shoulder replacement without functional use of left upper extremity secondary to effects of surgery and interscalene block and shoulder precautions. Therapist provided education and instruction to patient in regards to exercises, precautions, positioning, donning upper extremity clothing and bathing while maintaining shoulder precautions, ice and edema management and donning/doffing sling. Patient verbalized understanding and demonstrated as needed. Patient needed assistance to donn shirt, underwear, pants, socks and shoes and provided with instruction on compensatory strategies to perform ADLs. Patient to follow up with MD for further therapy needs.       Follow Up Recommendations  Follow surgeon's recommendation for DC plan and follow-up therapies    Equipment Recommendations  None recommended by OT    Recommendations for Other Services       Precautions / Restrictions Precautions Precautions: Shoulder Type of Shoulder Precautions: NO ROM shoulder. AROM elbow, wrist and hand Shoulder Interventions: Shoulder sling/immobilizer;At all times;Off for dressing/bathing/exercises Precaution Booklet Issued: Yes (comment) (hand out) Restrictions Weight Bearing Restrictions: Yes LUE Weight Bearing: Non weight bearing      Mobility Bed Mobility               General bed mobility comments: patient seated in recliner in PACU    Transfers                      Balance                                           ADL either  performed or assessed with clinical judgement   ADL Overall ADL's : Needs assistance/impaired Eating/Feeding: Modified independent;Sitting   Grooming: Wash/dry face;Sitting   Upper Body Bathing: Minimal assistance;Sitting   Lower Body Bathing: Minimal assistance;Sit to/from stand   Upper Body Dressing : Moderate assistance;Sitting   Lower Body Dressing: Minimal assistance;Sit to/from stand   Toilet Transfer: Supervision/safety   Toileting- Water quality scientist and Hygiene: Minimal assistance;Sit to/from stand       Functional mobility during ADLs: Supervision/safety       Vision Patient Visual Report: No change from baseline       Perception     Praxis      Pertinent Vitals/Pain Pain Assessment: No/denies pain (nerve block in place)     Hand Dominance Right   Extremity/Trunk Assessment Upper Extremity Assessment Upper Extremity Assessment: LUE deficits/detail LUE Deficits / Details: total reverse shoulder limitations at this time with nerve block still in place. LUE: Unable to fully assess due to immobilization           Communication Communication Communication: No difficulties   Cognition Arousal/Alertness: Awake/alert Behavior During Therapy: WFL for tasks assessed/performed Overall Cognitive Status: Within Functional Limits for tasks assessed                                     General Comments       Exercises  Shoulder Instructions Shoulder Instructions Donning/doffing shirt without moving shoulder: Modified independent Method for sponge bathing under operated UE: Modified independent Donning/doffing sling/immobilizer: Modified independent Correct positioning of sling/immobilizer: Modified independent ROM for elbow, wrist and digits of operated UE: Modified independent Sling wearing schedule (on at all times/off for ADL's): Modified independent Proper positioning of operated UE when showering: Modified  independent Positioning of UE while sleeping: Modified independent    Home Living Family/patient expects to be discharged to:: Private residence Living Arrangements: Spouse/significant other Available Help at Discharge: Family;Available 24 hours/day Type of Home: House                                  Prior Functioning/Environment                   OT Problem List:        OT Treatment/Interventions:      OT Goals(Current goals can be found in the care plan section)    OT Frequency:     Barriers to D/C:            Co-evaluation              AM-PAC OT "6 Clicks" Daily Activity     Outcome Measure Help from another person eating meals?: A Little Help from another person taking care of personal grooming?: A Little Help from another person toileting, which includes using toliet, bedpan, or urinal?: A Little Help from another person bathing (including washing, rinsing, drying)?: A Lot Help from another person to put on and taking off regular upper body clothing?: A Lot Help from another person to put on and taking off regular lower body clothing?: A Little 6 Click Score: 16   End of Session Nurse Communication: Other (comment) (cleared patient to participate in session)  Activity Tolerance: Patient tolerated treatment well Patient left: in chair;with call bell/phone within reach  OT Visit Diagnosis: Pain Pain - Right/Left: Left Pain - part of body: Shoulder                Time: [id number] OT Time Calculation (min): 28 min Charges:  OT General Charges $OT Visit: 1 Visit OT Evaluation $OT Eval Low Complexity: 1 Low OT Treatments $Self Care/Home Management : 8-22 mins  Jackelyn Poling OTR/L, MS Acute Rehabilitation Department Office# [phone] Pager# [phone]   [place] 08/06/2020, 4:07 PM

## 2020-08-06 NOTE — Interval H&P Note (Signed)
History and Physical Interval Note:  08/06/2020 9:59 AM  Teresa Shaffer  has presented today for surgery, with the diagnosis of djd left shoulder.  The various methods of treatment have been discussed with the patient and family. After consideration of risks, benefits and other options for treatment, the patient has consented to  Procedure(s): TOTAL SHOULDER ARTHROPLASTY (Left) as a surgical intervention.  The patient's history has been reviewed, patient examined, no change in status, stable for surgery.  I have reviewed the patient's chart and labs.  Questions were answered to the patient's satisfaction.  I have discussed the possibility of a reverse depending on the cuff seen intraop, although the CT looks like the cuff is intact.   Johnny Bridge

## 2020-08-06 NOTE — Discharge Instructions (Signed)
Diet: As you were doing prior to hospitalization   Shower:  May shower but keep the wounds dry, use an occlusive plastic wrap, NO SOAKING IN TUB.  If the bandage gets wet, change with a clean dry gauze.  If you have a splint on, leave the splint in place and keep the splint dry with a plastic bag.  Dressing:  You may change your dressing 3-5 days after surgery, unless you have a splint.  If you have a splint, then just leave the splint in place and we will change your bandages during your first follow-up appointment.    If you had hand or foot surgery, we will plan to remove your stitches in about 2 weeks in the office.  For all other surgeries, there are sticky tapes (steri-strips) on your wounds and all the stitches are absorbable.  Leave the steri-strips in place when changing your dressings, they will peel off with time, usually 2-3 weeks.  Activity:  Increase activity slowly as tolerated, but follow the weight bearing instructions below.  The rules on driving is that you can not be taking narcotics while you drive, and you must feel in control of the vehicle.    Weight Bearing:   No weight bearing with operative arm  To prevent constipation: you may use a stool softener such as -  Colace (over the counter) 100 mg by mouth twice a day  Drink plenty of fluids (prune juice may be helpful) and high fiber foods Miralax (over the counter) for constipation as needed.    Itching:  If you experience itching with your medications, try taking only a single pain pill, or even half a pain pill at a time.  You may take up to 10 pain pills per day, and you can also use benadryl over the counter for itching or also to help with sleep.   Precautions:  If you experience chest pain or shortness of breath - call 911 immediately for transfer to the hospital emergency department!!  If you develop a fever greater that 101 F, purulent drainage from wound, increased redness or drainage from wound, or calf pain --  Call the office at [phone]                                                Follow- Up Appointment:  Please call for an appointment to be seen in 2 weeks [place] - [phone]

## 2020-08-06 NOTE — Anesthesia Postprocedure Evaluation (Signed)
Anesthesia Post Note  Patient: Teresa Shaffer  Procedure(s) Performed: TOTAL SHOULDER ARTHROPLASTY (Left: Shoulder)     Patient location during evaluation: PACU Anesthesia Type: General and Regional Level of consciousness: awake and alert Pain management: pain level controlled Vital Signs Assessment: post-procedure vital signs reviewed and stable Respiratory status: spontaneous breathing, nonlabored ventilation and respiratory function stable Cardiovascular status: blood pressure returned to baseline and stable Postop Assessment: no apparent nausea or vomiting Anesthetic complications: no   No notable events documented.  Last Vitals:  Vitals:   08/06/20 1505 08/06/20 1526  BP: 112/85 139/81  Pulse: (!) 57 63  Resp: 19 16  Temp:    SpO2: 93% 93%    Last Pain:  Vitals:   08/06/20 1526  TempSrc:   PainSc: 0-No pain                 Railynn Ballo,W. EDMOND

## 2020-08-06 NOTE — Anesthesia Procedure Notes (Signed)
Anesthesia Regional Block: Interscalene brachial plexus block   Pre-Anesthetic Checklist: , timeout performed,  Correct Patient, Correct Site, Correct Laterality,  Correct Procedure, Correct Position, site marked,  Risks and benefits discussed,  Pre-op evaluation,  At surgeon's request and post-op pain management  Laterality: Left  Prep: Maximum Sterile Barrier Precautions used, chloraprep       Needles:  Injection technique: Single-shot  Needle Type: Echogenic Stimulator Needle     Needle Length: 5cm  Needle Gauge: 22     Additional Needles:   Procedures:,,,, ultrasound used (permanent image in chart),,    Narrative:  Start time: 08/06/2020 10:25 AM End time: 08/06/2020 10:35 AM Injection made incrementally with aspirations every 5 mL. Anesthesiologist: Roderic Palau, MD  Additional Notes: 2% Lidocaine skin wheel.

## 2020-08-06 NOTE — Progress Notes (Signed)
AssistedDr. Oren Bracket with left, ultrasound guided, interscalene  block. Side rails up, monitors on throughout procedure. See vital signs in flow sheet. Tolerated Procedure well.

## 2020-08-06 NOTE — Anesthesia Preprocedure Evaluation (Addendum)
Anesthesia Evaluation  Patient identified by MRN, date of birth, ID band Patient awake    Reviewed: Allergy & Precautions, H&P , NPO status , Patient's Chart, lab work & pertinent test results  Airway Mallampati: III  TM Distance: >3 FB Neck ROM: Full    Dental no notable dental hx. (+) Teeth Intact, Dental Advisory Given   Pulmonary neg pulmonary ROS,    Pulmonary exam normal breath sounds clear to auscultation       Cardiovascular negative cardio ROS   Rhythm:Regular Rate:Normal     Neuro/Psych negative neurological ROS  negative psych ROS   GI/Hepatic negative GI ROS, Neg liver ROS,   Endo/Other  negative endocrine ROS  Renal/GU negative Renal ROS  negative genitourinary   Musculoskeletal  (+) Arthritis , Osteoarthritis,    Abdominal   Peds  Hematology negative hematology ROS (+)   Anesthesia Other Findings   Reproductive/Obstetrics negative OB ROS                            Anesthesia Physical Anesthesia Plan  ASA: 2  Anesthesia Plan: General   Post-op Pain Management:  Regional for Post-op pain   Induction: Intravenous  PONV Risk Score and Plan: 4 or greater and Ondansetron, Dexamethasone and Treatment may vary due to age or medical condition  Airway Management Planned: Oral ETT  Additional Equipment:   Intra-op Plan:   Post-operative Plan: Extubation in OR  Informed Consent: I have reviewed the patients History and Physical, chart, labs and discussed the procedure including the risks, benefits and alternatives for the proposed anesthesia with the patient or authorized representative who has indicated his/her understanding and acceptance.     Dental advisory given  Plan Discussed with: CRNA  Anesthesia Plan Comments:         Anesthesia Quick Evaluation

## 2020-08-06 NOTE — Op Note (Signed)
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  ANESTHESIA:   General with interscalene block using Exparel  ESTIMATED BLOOD LOSS: 200 mL  UNIQUE ASPECTS OF THE CASE: I encountered substantial joint fluid upon getting into the deep space, even before I got into the joint however.  The supraspinatus had a crescent-shaped tear, it was probably at least 1 x 1 cm.  I debated whether she needed a primary arthroplasty or a reverse, however she has had a contralateral massive cuff tear and I was concerned that the current side may be destined for rotator cuff arthropathy, so elected to perform a reverse.  PREOPERATIVE INDICATIONS:  Teresa Shaffer is a  80 y.o. female with a diagnosis of djd left shoulder who failed conservative measures and elected for surgical management.    The risks benefits and alternatives were discussed with the patient preoperatively including but not limited to the risks of infection, bleeding, nerve injury, cardiopulmonary complications, the need for revision surgery, dislocation, brachial plexus palsy, incomplete relief of pain, among others, and the patient was willing to proceed.  OPERATIVE IMPLANTS: Biomet size 8 micro humeral stem press-fit with a 40 + 3 offset reverse shoulder arthroplasty tray with a 36 mm Vivacit-E standard polyethylene liner and a 36 mm glenosphere set on B placed with inferior offset, with a mini baseplate and 4 locking screws and one central nonlocking screw.  OPERATIVE FINDINGS: Advanced  glenohumeral osteoarthritis with full-thickness chondral loss, along with a supraspinatus tear  OPERATIVE PROCEDURE: The patient was brought to the operating room and placed in the supine position. General anesthesia was administered. IV antibiotics were given.  Time out was performed. The upper extremity was prepped and draped in usual sterile fashion. The patient was in a beachchair position. Deltopectoral approach was carried out. The biceps was tenodesed to the pectoralis tendon with #2 Fiberwire. The subscapularis was released off of the bone.   I then performed circumferential releases of the humerus, and then dislocated the head, and then reamed with the reamer to the above named size.  I then applied the jig, and cut the humeral head in 30 of retroversion, and then turned my attention to the glenoid.  Deep retractors were placed, and I resected the labrum, and then placed a guidepin into the center position on the glenoid, with slight inferior inclination. I then reamed over the guidepin, and this created a small metaphyseal cancellus blush inferiorly, removing just the cartilage to the subchondral bone superiorly. The base plate was selected and impacted place, and then I secured it centrally with a nonlocking screw, and I had excellent purchase both inferiorly and superiorly. I placed a short locking screws on anterior and posterior aspects.  I then turned my attention to the glenosphere, and impacted this into place, placing slight inferior offset (set on B).   The glenosphere was completely seated, and had engagement of the [facility] taper. I then turned my attention back to the humerus.  I sequentially broached, and then trialed, and was found to restore soft tissue tension, and it had 2 finger tightness. Therefore the above named components were  selected. The shoulder felt stable throughout functional motion.  Before I placed the real prosthesis I had also placed a total of 4 high tensile  sutures, 2 #2 max braid, 2 #5 max braid, through holes in the humerus for later subscapularis repair.  I then impacted the real prosthesis into place, as well as the real humeral tray, and reduced the shoulder. The shoulder had excellent motion, and was stable, and I irrigated the wounds copiously.    I then used these to repair the subscapularis. This came down to bone.  I then irrigated the shoulder copiously once more, repaired the deltopectoral interval with Vicryl followed by subcutaneous Vicryl with Steri-Strips and sterile gauze for the skin. The patient was awakened and returned back in stable and satisfactory condition. There were no complications and She tolerated the procedure well.

## 2020-08-07 ENCOUNTER — Encounter (HOSPITAL_COMMUNITY): Payer: Self-pay | Admitting: Orthopedic Surgery

## 2020-08-19 DIAGNOSIS — M19012 Primary osteoarthritis, left shoulder: Secondary | ICD-10-CM | POA: Diagnosis not present

## 2020-08-22 ENCOUNTER — Other Ambulatory Visit: Payer: Self-pay

## 2020-08-22 ENCOUNTER — Ambulatory Visit: Payer: Medicare PPO | Admitting: Family Medicine

## 2020-08-22 ENCOUNTER — Encounter: Payer: Self-pay | Admitting: Family Medicine

## 2020-08-22 DIAGNOSIS — J302 Other seasonal allergic rhinitis: Secondary | ICD-10-CM | POA: Diagnosis not present

## 2020-08-22 DIAGNOSIS — Z9189 Other specified personal risk factors, not elsewhere classified: Secondary | ICD-10-CM

## 2020-08-22 DIAGNOSIS — G609 Hereditary and idiopathic neuropathy, unspecified: Secondary | ICD-10-CM | POA: Diagnosis not present

## 2020-08-22 LAB — POCT GLYCOSYLATED HEMOGLOBIN (HGB A1C): HbA1c, POC (controlled diabetic range): 6.3 % (ref 0.0–7.0)

## 2020-08-22 NOTE — Patient Instructions (Addendum)
I am glad to hear you are doing do well post-op.  Your Blood sugar is improved.  Your A1c is down from 6.8% to 6.3%.  Keep up your watching sweets and staying active.   You blood pressure is very good.   It sounds like your next CoViD booster should be in mid-November, 4 months after your first booster.

## 2020-08-23 ENCOUNTER — Encounter: Payer: Self-pay | Admitting: Family Medicine

## 2020-08-23 NOTE — Progress Notes (Addendum)
Teresa Shaffer is accompanied by patient and husband Sources of clinical information for visit is/are patient and past medical records. Nursing assessment for this office visit was reviewed with the patient for accuracy and revision.     Previous Report(s) Reviewed: lab reports, office notes, and notes from orthopedics  Depression screen [facility] 2/9 08/22/2020  Decreased Interest 0  Down, Depressed, Hopeless 0  PHQ - 2 Score 0  Altered sleeping 0  Tired, decreased energy 0  Change in appetite 0  Feeling bad or failure about yourself  0  Trouble concentrating 0  Moving slowly or fidgety/restless 0  Suicidal thoughts 0  PHQ-9 Score 0  Difficult doing work/chores Not difficult at all    Fall Risk  08/22/2020 09/07/2019 12/13/2018 12/08/2017 09/30/2017  Falls in the past year? 0 0 0 0 No  Number falls in past yr: 0 0 - - -  Injury with Fall? 0 0 - - -  Follow up - Falls evaluation completed - - -    PHQ9 SCORE ONLY 08/22/2020 09/07/2019 12/13/2018  PHQ-9 Total Score 0 2 0    Adult vaccines due  Topic Date Due   TETANUS/TDAP  03/03/2022    Health Maintenance Due  Topic Date Due   Zoster Vaccines- Shingrix (1 of 2) Never done   INFLUENZA VACCINE  08/05/2020      History/P.E. limitations: none  Adult vaccines due  Topic Date Due   TETANUS/TDAP  03/03/2022   There are no preventive care reminders to display for this patient.  Health Maintenance Due  Topic Date Due   Zoster Vaccines- Shingrix (1 of 2) Never done   INFLUENZA VACCINE  08/05/2020     Chief Complaint  Patient presents with   A1c   Check up   Medication Refill    Pt stated she needs a refill on Gabepentin and Nasal spray    PSH:  Teresa Shaffer is 2 weeks s/p reverse left shoulder arthroplasty by Dr Mardelle Matte (Ortho).  She reports no pain currently.  Allowed occasional passive gravity minimal ROM

## 2020-08-26 ENCOUNTER — Encounter: Payer: Self-pay | Admitting: Family Medicine

## 2020-08-26 NOTE — Assessment & Plan Note (Signed)
Established problem Well Controlled. No signs of complications, medication side effects, or red flags. Continue current medications and other regiments.  

## 2020-08-26 NOTE — Assessment & Plan Note (Signed)
Established problem. Stable. No signs of complications, medication side effects, or red flags. Continue gabapentin

## 2020-08-26 NOTE — Addendum Note (Signed)
Addended by: Lissa Morales D on: 08/26/2020 09:25 AM   Modules accepted: Level of Service

## 2020-08-26 NOTE — Assessment & Plan Note (Signed)
Lab Results  Component Value Date   HGBA1C 6.3 08/22/2020   Improved A1c with lifestyle management No symptoms of end-organ damage.  Teresa Shaffer is receiving ophthalmological care.

## 2020-09-02 ENCOUNTER — Other Ambulatory Visit (HOSPITAL_COMMUNITY): Payer: Self-pay

## 2020-09-02 MED FILL — Gabapentin Tab 800 MG: ORAL | 30 days supply | Qty: 120 | Fill #3 | Status: AC

## 2020-09-03 ENCOUNTER — Other Ambulatory Visit (HOSPITAL_COMMUNITY): Payer: Self-pay

## 2020-10-03 ENCOUNTER — Other Ambulatory Visit (HOSPITAL_COMMUNITY): Payer: Self-pay

## 2020-10-03 MED FILL — Gabapentin Tab 800 MG: ORAL | 30 days supply | Qty: 120 | Fill #4 | Status: AC

## 2020-10-04 ENCOUNTER — Other Ambulatory Visit (HOSPITAL_COMMUNITY): Payer: Self-pay

## 2020-10-08 DIAGNOSIS — M19012 Primary osteoarthritis, left shoulder: Secondary | ICD-10-CM | POA: Diagnosis not present

## 2020-10-08 DIAGNOSIS — M6281 Muscle weakness (generalized): Secondary | ICD-10-CM | POA: Diagnosis not present

## 2020-10-08 DIAGNOSIS — M25612 Stiffness of left shoulder, not elsewhere classified: Secondary | ICD-10-CM | POA: Diagnosis not present

## 2020-10-08 DIAGNOSIS — Z96612 Presence of left artificial shoulder joint: Secondary | ICD-10-CM | POA: Diagnosis not present

## 2020-10-25 DIAGNOSIS — M6281 Muscle weakness (generalized): Secondary | ICD-10-CM | POA: Diagnosis not present

## 2020-10-25 DIAGNOSIS — M25612 Stiffness of left shoulder, not elsewhere classified: Secondary | ICD-10-CM | POA: Diagnosis not present

## 2020-10-25 DIAGNOSIS — Z96612 Presence of left artificial shoulder joint: Secondary | ICD-10-CM | POA: Diagnosis not present

## 2020-10-25 DIAGNOSIS — M19012 Primary osteoarthritis, left shoulder: Secondary | ICD-10-CM | POA: Diagnosis not present

## 2020-11-05 ENCOUNTER — Ambulatory Visit (INDEPENDENT_AMBULATORY_CARE_PROVIDER_SITE_OTHER): Payer: Medicare PPO | Admitting: Ophthalmology

## 2020-11-05 ENCOUNTER — Other Ambulatory Visit: Payer: Self-pay

## 2020-11-05 ENCOUNTER — Encounter (INDEPENDENT_AMBULATORY_CARE_PROVIDER_SITE_OTHER): Payer: Self-pay | Admitting: Ophthalmology

## 2020-11-05 DIAGNOSIS — H2511 Age-related nuclear cataract, right eye: Secondary | ICD-10-CM

## 2020-11-05 DIAGNOSIS — H353211 Exudative age-related macular degeneration, right eye, with active choroidal neovascularization: Secondary | ICD-10-CM

## 2020-11-05 DIAGNOSIS — H2513 Age-related nuclear cataract, bilateral: Secondary | ICD-10-CM | POA: Diagnosis not present

## 2020-11-05 DIAGNOSIS — H353122 Nonexudative age-related macular degeneration, left eye, intermediate dry stage: Secondary | ICD-10-CM | POA: Diagnosis not present

## 2020-11-05 DIAGNOSIS — H2512 Age-related nuclear cataract, left eye: Secondary | ICD-10-CM

## 2020-11-05 MED ORDER — AFLIBERCEPT 2MG/0.05ML IZ SOLN FOR KALEIDOSCOPE
2.0000 mg | INTRAVITREAL | Status: AC | PRN
  Administered 2020-11-05: 2 mg via INTRAVITREAL

## 2020-11-05 NOTE — Assessment & Plan Note (Addendum)
Progression of NSC changes, follow-up with Dr. Delight Stare for evaluation.

## 2020-11-05 NOTE — Progress Notes (Addendum)
11/05/2020     CHIEF COMPLAINT Patient presents for  Chief Complaint  Patient presents with   Retina Follow Up      HISTORY OF PRESENT ILLNESS: Teresa Shaffer is a 80 y.o. female who presents to the clinic today for:   HPI     Retina Follow Up   Patient presents with  Wet AMD.  In both eyes.  This started 3 months ago.  Duration of 3 months.  Since onset it is stable.      Last edited by [name], COA on 11/05/2020  9:46 AM.      Referring physician: [name], MD [address] [place],  [place] [address]  HISTORICAL INFORMATION:   Selected notes from the MEDICAL RECORD NUMBER    Lab Results  Component Value Date   HGBA1C 6.3 08/22/2020     CURRENT MEDICATIONS: Current Outpatient Medications (Ophthalmic Drugs)  Medication Sig   Aflibercept (EYLEA) 2 MG/0.05ML SOLN by Intravitreal route every 3 (three) months.   No current facility-administered medications for this visit. (Ophthalmic Drugs)   Current Outpatient Medications (Other)  Medication Sig   Calcium Carbonate-Vitamin D 600-400 MG-UNIT tablet Take 1 tablet by mouth daily.   cetirizine (ZYRTEC) 10 MG tablet Take 10 mg by mouth daily.   flunisolide (NASALIDE) 25 MCG/ACT (0.025%) SOLN PLACE 2 SPRAYS INTO EACH NOSTRIL 2 TIMES DAILY AS NEEDED (Patient taking differently: Place 2 sprays into the nose in the morning and at bedtime.)   gabapentin (NEURONTIN) 800 MG tablet TAKE 1 TABLET BY MOUTH 4 TIMES DAILY (Patient taking differently: Take 800 mg by mouth 4 (four) times daily.)   hydrocortisone cream 1 % Apply 1 application topically daily. Apply to face   magnesium oxide (MAG-OX) 400 MG tablet Take 400 mg by mouth daily.   Multiple Vitamin (MULTIVITAMINS PO) Take 1 tablet by mouth daily.   Multiple Vitamins-Minerals (PRESERVISION AREDS) TABS Take 1 capsule by mouth in the morning and at bedtime.   No current facility-administered medications for this visit. (Other)      REVIEW OF  SYSTEMS:    ALLERGIES No Known Allergies  PAST MEDICAL HISTORY Past Medical History:  Diagnosis Date   Adhesive capsulitis of right shoulder 10/25/2014   S/P right rotator cuff repair    Advanced nonexudative age-related macular degeneration of right eye with subfoveal involvement 12/27/2019   Allergic reaction 02/07/2018   Allergic rhinoconjunctivitis 08/05/2017   Cerumen impaction 09/02/2012   Chest pain 04/24/2013   Complete rotator cuff tear 02/16/2013   On scan today she appears to have the anterior and superior capsule torn away with retraction    Degenerative joint disease of knee, left 08/04/2011   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 Minimal DJD changes on xray. Sunrise view wasn't done.     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 Not in range to require treatment    Intermediate stage nonexudative age-related macular degeneration of both eyes 05/03/2019   The nature of dry age related macular degeneration was  discussed with the patient as well as its possible conversion to wet. The results of the AREDS 2 study was discussed with the patient. A diet rich in dark leafy green vegetables was advised and specific recommendations were made regarding supplements with AREDS 2 formulation . Control of hypertension and serum cholesterol may slow the disease.   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 Minimal DJD changes on xray. Sunrise view wasn't done.     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 Magod MD [facility]) - diagnostic colonoscopy for hematochezia   Unsteady gait 09/02/2012   Valgus deformity of great toes, bilateral 08/28/2016   Past Surgical History:  Procedure Laterality Date   BREAST CYST ASPIRATION  1999   benign fibrocystic disease   COLONOSCOPY  09/2014   No polyps, Dr Cleaster Corin.    COLONOSCOPY W/ BIOPSIES AND POLYPECTOMY  12/2003   tubular adenoma   ENDOMETRIAL BIOPSY     benign   ROTATOR CUFF REPAIR Right    Dr Mardelle Matte (Ortho)   TONSILLECTOMY  1953   TOTAL SHOULDER ARTHROPLASTY Left 08/06/2020   Procedure: TOTAL SHOULDER ARTHROPLASTY;  Surgeon: Marchia Bond, MD;  Location: [facility];  Service: Orthopedics;  Laterality: Left;   TYMPANOPLASTY WITH GRAFT Left 11/17/2016   Surgeon: Vicie Mutters MD (ENT)     FAMILY HISTORY Family History  Problem Relation Age of Onset   Neuropathy Father    Diabetes Father    Coronary artery disease Father    Neuropathy Brother    Neuropathy Brother    Diabetes Brother     SOCIAL HISTORY Social History   Tobacco Use   Smoking status: Never   Smokeless tobacco: Never  Vaping Use   Vaping Use: Never used  Substance Use Topics   Alcohol use:  Not Currently    Comment: rare alcohol   Drug use: No         OPHTHALMIC EXAM:  Base Eye Exam     Visual Acuity (ETDRS)       Right Left   Dist cc CF at 3' 20/32 -2   Dist ph cc NI NI    Correction: Glasses         Tonometry (Tonopen, 9:55 AM)       Right Left   Pressure 12 12         Pupils       Pupils Dark Light Shape React APD   Right PERRL 4 3 Round Brisk None   Left PERRL 4 3 Round Brisk None         Visual Fields (Counting fingers)  Left Right    Full Full         Extraocular Movement       Right Left    Full, Ortho Full, Ortho         Neuro/Psych     Oriented x3: Yes   Mood/Affect: Normal         Dilation     Both eyes: 1.0% Mydriacyl, 2.5% Phenylephrine @ 9:55 AM           Slit Lamp and Fundus Exam     External Exam       Right Left   External Normal Normal         Slit Lamp Exam       Right Left   Lids/Lashes Normal Normal   Conjunctiva/Sclera White and quiet White and quiet   Cornea Clear Clear   Anterior Chamber Deep and quiet Deep and quiet   Iris Round and reactive Round and reactive   Lens 3+ Nuclear sclerosis 3+ Nuclear sclerosis   Anterior Vitreous Normal Normal         Fundus Exam       Right Left   Posterior Vitreous Posterior vitreous detachment Posterior vitreous detachment   Disc Normal Normal   C/D Ratio 0.25 0.3   Macula Disciform scar, Hard drusen, no exudates, Macular thickening, no hemorrhage, Retinal pigment epithelial mottling, Advanced age related macular degeneration, Choroidal nevus no change, Geographic atrophy into the FAZ Drusen, Soft drusen, Intermediate age related macular degeneration, no membrane, no hemorrhage, , Retinal pigment epithelial mottling   Vessels Normal Normal   Periphery Normal Normal            IMAGING AND PROCEDURES  Imaging and Procedures for 11/05/20  OCT, Retina - OU - Both Eyes       Right Eye Quality was good. Scan locations included  subfoveal. Central Foveal Thickness: 333. Progression has improved. Findings include subretinal scarring, disciform scar, abnormal foveal contour, intraretinal fluid, subretinal fluid.   Left Eye Quality was good. Scan locations included subfoveal. Central Foveal Thickness: 254. Progression has been stable. Findings include abnormal foveal contour, retinal drusen , no IRF, no SRF.   Notes No signs of CNVM left eye  OD vastly improved macular thickness as compared to onset of disease December 2019 as well as no active CNVM at 25-month interval.  Slightly less subretinal fluid and slight amount of intraretinal fluid remains OD but stabilized     Intravitreal Injection, Pharmacologic Agent - OD - Right Eye       Time Out 11/05/2020. 10:05 AM. Confirmed correct patient, procedure, site, and patient consented.   Anesthesia Topical anesthesia was used. Anesthetic medications included [place] 3.5%.   Procedure Preparation included Tobramycin 0.3%, 10% betadine to eyelids, 5% betadine to ocular surface. A 30 gauge needle was used.   Injection: 2 mg aflibercept 2 MG/0.05ML   Route: Intravitreal, Site: Right Eye   NDC: [id number], Lot: [id number], Waste: 0 mL   Post-op Post injection exam found visual acuity of at least counting fingers. The patient tolerated the procedure well. There were no complications. The patient received written and verbal post procedure care education. Post injection medications included ocuflox.              ASSESSMENT/PLAN:  Exudative age-related macular degeneration of right eye with active choroidal neovascularization ([place]) OD vastly improved overall yet still with vision loss due to subfoveal atrophy.  Early at 74-month follow-up interval to control enlargement and prevent enlargement  of scotoma.  Repeat intravitreal Eylea today and maintain 98-month follow-up interval  Nuclear sclerotic cataract of right eye Visually significant cataract but not the  limiting factor in the right eye.  Needs CE IOL OD first prior to left eye once the decision made for cataract surgery and visual acuity improvement for the left eye is undertaken  Nuclear sclerotic cataract of left eye Progression of [place] changes, follow-up with Dr. Delight Stare for evaluation.  Intermediate stage nonexudative age-related macular degeneration of left eye No signs of CNVM OS     ICD-10-CM   1. Exudative age-related macular degeneration of right eye with active choroidal neovascularization (HCC)  H35.3211 OCT, Retina - OU - Both Eyes    Intravitreal Injection, Pharmacologic Agent - OD - Right Eye    aflibercept (EYLEA) SOLN 2 mg    2. Nuclear sclerotic cataract of right eye  H25.11     3. Nuclear sclerotic cataract of left eye  H25.12     4. 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  2.  3.  Ophthalmic Meds Ordered this visit:  Meds ordered this encounter  Medications   aflibercept (EYLEA) SOLN 2 mg       Return in about 3 months (around 02/05/2021) for DILATE OU, COLOR FP, OCT, EYLEA OCT, OD.  There are no Patient Instructions on file for this visit.   Explained the diagnoses, plan, and follow up with the patient and they expressed understanding.  Patient expressed understanding of the importance of proper follow up care.   Clent Demark Devone Bonilla M.D. Diseases & Surgery of the Retina and Vitreous [facility] [place] 11/05/20     Abbreviations: M myopia (nearsighted); A astigmatism; H hyperopia (farsighted); P presbyopia; Mrx spectacle prescription;  CTL contact lenses; OD right eye; OS left eye; OU both eyes  XT exotropia; ET esotropia; PEK punctate epithelial keratitis; PEE punctate epithelial erosions; DES dry eye syndrome; MGD meibomian gland dysfunction; ATs artificial tears; PFAT's preservative free artificial tears; [place] nuclear sclerotic cataract; PSC posterior subcapsular cataract; ERM  epi-retinal membrane; PVD posterior vitreous detachment; RD retinal detachment; DM diabetes mellitus; DR diabetic retinopathy; NPDR non-proliferative diabetic retinopathy; PDR proliferative diabetic retinopathy; CSME clinically significant macular edema; DME diabetic macular edema; dbh dot blot hemorrhages; CWS cotton wool spot; POAG primary open angle glaucoma; C/D cup-to-disc ratio; HVF humphrey visual field; GVF goldmann visual field; OCT optical coherence tomography; IOP intraocular pressure; BRVO Branch retinal vein occlusion; CRVO central retinal vein occlusion; CRAO central retinal artery occlusion; BRAO branch retinal artery occlusion; RT retinal tear; SB scleral buckle; PPV pars plana vitrectomy; VH Vitreous hemorrhage; PRP panretinal laser photocoagulation; IVK intravitreal kenalog; VMT vitreomacular traction; MH Macular hole;  NVD neovascularization of the disc; NVE neovascularization elsewhere; AREDS age related eye disease study; ARMD age related macular degeneration; POAG primary open angle glaucoma; EBMD epithelial/anterior basement membrane dystrophy; ACIOL anterior chamber intraocular lens; IOL intraocular lens; PCIOL posterior chamber intraocular lens; Phaco/IOL phacoemulsification with intraocular lens placement; [place] photorefractive keratectomy; LASIK laser assisted in situ keratomileusis; HTN hypertension; DM diabetes mellitus; COPD chronic obstructive pulmonary disease

## 2020-11-05 NOTE — Assessment & Plan Note (Signed)
OD vastly improved overall yet still with vision loss due to subfoveal atrophy.  Early at 20-month follow-up interval to control enlargement and prevent enlargement of scotoma.  Repeat intravitreal Eylea today and maintain 74-month follow-up interval

## 2020-11-05 NOTE — Assessment & Plan Note (Signed)
Visually significant cataract but not the limiting factor in the right eye.  Needs CE IOL OD first prior to left eye once the decision made for cataract surgery and visual acuity improvement for the left eye is undertaken

## 2020-11-05 NOTE — Assessment & Plan Note (Signed)
No signs of CNVM OS

## 2020-11-08 DIAGNOSIS — M6281 Muscle weakness (generalized): Secondary | ICD-10-CM | POA: Diagnosis not present

## 2020-11-08 DIAGNOSIS — M19012 Primary osteoarthritis, left shoulder: Secondary | ICD-10-CM | POA: Diagnosis not present

## 2020-11-08 DIAGNOSIS — Z96612 Presence of left artificial shoulder joint: Secondary | ICD-10-CM | POA: Diagnosis not present

## 2020-11-08 DIAGNOSIS — M25612 Stiffness of left shoulder, not elsewhere classified: Secondary | ICD-10-CM | POA: Diagnosis not present

## 2020-11-18 DIAGNOSIS — M6281 Muscle weakness (generalized): Secondary | ICD-10-CM | POA: Diagnosis not present

## 2020-11-18 DIAGNOSIS — M25612 Stiffness of left shoulder, not elsewhere classified: Secondary | ICD-10-CM | POA: Diagnosis not present

## 2020-11-18 DIAGNOSIS — M19012 Primary osteoarthritis, left shoulder: Secondary | ICD-10-CM | POA: Diagnosis not present

## 2020-11-18 DIAGNOSIS — Z96612 Presence of left artificial shoulder joint: Secondary | ICD-10-CM | POA: Diagnosis not present

## 2020-11-22 ENCOUNTER — Other Ambulatory Visit (HOSPITAL_COMMUNITY): Payer: Self-pay

## 2020-11-22 MED FILL — Gabapentin Tab 800 MG: ORAL | 30 days supply | Qty: 120 | Fill #5 | Status: AC

## 2020-11-25 ENCOUNTER — Other Ambulatory Visit (HOSPITAL_COMMUNITY): Payer: Self-pay

## 2020-12-02 DIAGNOSIS — M6281 Muscle weakness (generalized): Secondary | ICD-10-CM | POA: Diagnosis not present

## 2020-12-02 DIAGNOSIS — M25612 Stiffness of left shoulder, not elsewhere classified: Secondary | ICD-10-CM | POA: Diagnosis not present

## 2020-12-02 DIAGNOSIS — M19012 Primary osteoarthritis, left shoulder: Secondary | ICD-10-CM | POA: Diagnosis not present

## 2020-12-02 DIAGNOSIS — Z96612 Presence of left artificial shoulder joint: Secondary | ICD-10-CM | POA: Diagnosis not present

## 2020-12-04 DIAGNOSIS — Z96612 Presence of left artificial shoulder joint: Secondary | ICD-10-CM | POA: Diagnosis not present

## 2020-12-04 DIAGNOSIS — M25612 Stiffness of left shoulder, not elsewhere classified: Secondary | ICD-10-CM | POA: Diagnosis not present

## 2020-12-04 DIAGNOSIS — M6281 Muscle weakness (generalized): Secondary | ICD-10-CM | POA: Diagnosis not present

## 2020-12-04 DIAGNOSIS — M19012 Primary osteoarthritis, left shoulder: Secondary | ICD-10-CM | POA: Diagnosis not present

## 2020-12-19 ENCOUNTER — Other Ambulatory Visit (HOSPITAL_COMMUNITY): Payer: Self-pay

## 2020-12-19 MED FILL — Gabapentin Tab 800 MG: ORAL | 30 days supply | Qty: 120 | Fill #6 | Status: AC

## 2020-12-20 ENCOUNTER — Other Ambulatory Visit (HOSPITAL_COMMUNITY): Payer: Self-pay

## 2021-01-10 ENCOUNTER — Other Ambulatory Visit (HOSPITAL_COMMUNITY): Payer: Self-pay

## 2021-02-04 DIAGNOSIS — H2513 Age-related nuclear cataract, bilateral: Secondary | ICD-10-CM | POA: Diagnosis not present

## 2021-02-05 ENCOUNTER — Other Ambulatory Visit: Payer: Self-pay

## 2021-02-05 ENCOUNTER — Encounter (INDEPENDENT_AMBULATORY_CARE_PROVIDER_SITE_OTHER): Payer: Self-pay | Admitting: Ophthalmology

## 2021-02-05 ENCOUNTER — Ambulatory Visit (INDEPENDENT_AMBULATORY_CARE_PROVIDER_SITE_OTHER): Payer: Medicare PPO | Admitting: Ophthalmology

## 2021-02-05 ENCOUNTER — Encounter (INDEPENDENT_AMBULATORY_CARE_PROVIDER_SITE_OTHER): Payer: Medicare PPO | Admitting: Ophthalmology

## 2021-02-05 DIAGNOSIS — H2513 Age-related nuclear cataract, bilateral: Secondary | ICD-10-CM | POA: Diagnosis not present

## 2021-02-05 DIAGNOSIS — H353211 Exudative age-related macular degeneration, right eye, with active choroidal neovascularization: Secondary | ICD-10-CM | POA: Diagnosis not present

## 2021-02-05 DIAGNOSIS — H353122 Nonexudative age-related macular degeneration, left eye, intermediate dry stage: Secondary | ICD-10-CM

## 2021-02-05 DIAGNOSIS — H2512 Age-related nuclear cataract, left eye: Secondary | ICD-10-CM

## 2021-02-05 DIAGNOSIS — H2511 Age-related nuclear cataract, right eye: Secondary | ICD-10-CM

## 2021-02-05 MED ORDER — AFLIBERCEPT 2MG/0.05ML IZ SOLN FOR KALEIDOSCOPE
2.0000 mg | INTRAVITREAL | Status: AC | PRN
  Administered 2021-02-05: 2 mg via INTRAVITREAL

## 2021-02-05 NOTE — Progress Notes (Signed)
02/05/2021     CHIEF COMPLAINT Patient presents for  Chief Complaint  Patient presents with   Retina Follow Up      HISTORY OF PRESENT ILLNESS: Teresa Shaffer is a 81 y.o. female who presents to the clinic today for:   HPI     Retina Follow Up           Diagnosis: Wet AMD   Laterality: both eyes   Onset: 3 months ago   Severity: mild   Duration: 3 months   Course: gradually worsening         Comments   3 mos fu OU oct, FP, Eylea OD. Pt states "I think my cataracts are worse, I can tell it is getting gradually more blurred. I saw Dr. Delman Cheadle yesterday and we talked about moving forward with the cataract surgery. Denies new FOL or floaters.      Last edited by Laurin Coder on 02/05/2021 10:56 AM.      Referring physician: McDiarmid, Blane Ohara, MD [place],  [place] [id number]  HISTORICAL INFORMATION:   Selected notes from the MEDICAL RECORD NUMBER    Lab Results  Component Value Date   HGBA1C 6.3 08/22/2020     CURRENT MEDICATIONS: Current Outpatient Medications (Ophthalmic Drugs)  Medication Sig   Aflibercept (EYLEA) 2 MG/0.05ML SOLN by Intravitreal route every 3 (three) months.   No current facility-administered medications for this visit. (Ophthalmic Drugs)   Current Outpatient Medications (Other)  Medication Sig   Calcium Carbonate-Vitamin D 600-400 MG-UNIT tablet Take 1 tablet by mouth daily.   cetirizine (ZYRTEC) 10 MG tablet Take 10 mg by mouth daily.   flunisolide (NASALIDE) 25 MCG/ACT (0.025%) SOLN PLACE 2 SPRAYS INTO EACH NOSTRIL 2 TIMES DAILY AS NEEDED (Patient taking differently: Place 2 sprays into the nose in the morning and at bedtime.)   gabapentin (NEURONTIN) 800 MG tablet TAKE 1 TABLET BY MOUTH 4 TIMES DAILY (Patient taking differently: Take 800 mg by mouth 4 (four) times daily.)   hydrocortisone cream 1 % Apply 1 application topically daily. Apply to face   magnesium oxide (MAG-OX) 400 MG tablet Take 400 mg by  mouth daily.   Multiple Vitamin (MULTIVITAMINS PO) Take 1 tablet by mouth daily.   Multiple Vitamins-Minerals (PRESERVISION AREDS) TABS Take 1 capsule by mouth in the morning and at bedtime.   No current facility-administered medications for this visit. (Other)      REVIEW OF SYSTEMS:    ALLERGIES No Known Allergies  PAST MEDICAL HISTORY Past Medical History:  Diagnosis Date   Adhesive capsulitis of right shoulder 10/25/2014   S/P right rotator cuff repair    Advanced nonexudative age-related macular degeneration of right eye with subfoveal involvement 12/27/2019   Allergic reaction 02/07/2018   Allergic rhinoconjunctivitis 08/05/2017   Cerumen impaction 09/02/2012   Chest pain 04/24/2013   Complete rotator cuff tear 02/16/2013   On scan today she appears to have the anterior and superior capsule torn away with retraction    Degenerative joint disease of knee, left 08/04/2011   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 Minimal DJD changes on xray. Sunrise view wasn't done.     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 Not in range to require treatment    Intermediate stage nonexudative age-related macular degeneration of both eyes 05/03/2019   The nature of dry age related macular degeneration was discussed with the patient as well as its possible conversion to wet. The results of the AREDS 2 study was discussed with the patient. A diet rich in dark leafy green vegetables was advised and specific recommendations were made  regarding supplements with AREDS 2 formulation . Control of hypertension and serum cholesterol may slow the disease.   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 Minimal DJD changes on xray. Sunrise view wasn't done.     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 Magod MD [facility]) - diagnostic colonoscopy for hematochezia   Unsteady gait 09/02/2012   Valgus deformity of great toes, bilateral 08/28/2016   Past Surgical History:  Procedure Laterality Date   BREAST CYST ASPIRATION  1999   benign fibrocystic disease   COLONOSCOPY  09/2014   No polyps, Dr Cleaster Corin.    COLONOSCOPY W/ BIOPSIES AND POLYPECTOMY  12/2003   tubular adenoma   ENDOMETRIAL BIOPSY     benign   ROTATOR CUFF REPAIR Right    Dr Mardelle Matte (Ortho)   TONSILLECTOMY  1953   TOTAL SHOULDER ARTHROPLASTY Left 08/06/2020   Procedure: TOTAL SHOULDER ARTHROPLASTY;  Surgeon: Marchia Bond, MD;  Location: [facility];  Service: Orthopedics;  Laterality: Left;   TYMPANOPLASTY WITH GRAFT Left 11/17/2016   Surgeon: Vicie Mutters MD (ENT)     FAMILY HISTORY Family History  Problem Relation Age of Onset   Neuropathy Father    Diabetes Father     Coronary artery disease Father    Neuropathy Brother    Neuropathy Brother    Diabetes Brother     SOCIAL HISTORY Social History   Tobacco Use   Smoking status: Never   Smokeless tobacco: Never  Vaping Use   Vaping Use: Never used  Substance Use Topics   Alcohol use: Not Currently    Comment: rare alcohol   Drug use: No         OPHTHALMIC EXAM:  Base Eye Exam     Visual Acuity (ETDRS)       Right Left   Dist cc CF at 3' 20/30 -1    Correction: Glasses         Tonometry (Tonopen, 11:01 AM)       Right Left  Pressure 11 13         Pupils       Pupils Dark Light Shape React APD   Right PERRL 4 3 Round Brisk None   Left PERRL 4 3 Round Brisk None         Extraocular Movement       Right Left    Full Full         Neuro/Psych     Oriented x3: Yes   Mood/Affect: Normal         Dilation     Right eye: 1.0% Mydriacyl, 2.5% Phenylephrine @ 11:01 AM           Slit Lamp and Fundus Exam     External Exam       Right Left   External Normal Normal         Slit Lamp Exam       Right Left   Lids/Lashes Normal Normal   Conjunctiva/Sclera White and quiet White and quiet   Cornea Clear Clear   Anterior Chamber Deep and quiet Deep and quiet   Iris Round and reactive Round and reactive   Lens 3+ Nuclear sclerosis 3+ Nuclear sclerosis   Anterior Vitreous Normal Normal         Fundus Exam       Right Left   Posterior Vitreous Posterior vitreous detachment Posterior vitreous detachment   Disc Normal Normal   C/D Ratio 0.2 0.3   Macula Disciform scar, Hard drusen, no exudates, Macular thickening, no hemorrhage, Retinal pigment epithelial mottling, Advanced age related macular degeneration, Choroidal nevus no change, Geographic atrophy into the FAZ Drusen, Soft drusen, Intermediate age related macular degeneration, no membrane, no hemorrhage, , Retinal pigment epithelial mottling   Vessels Normal Normal   Periphery Normal Normal             IMAGING AND PROCEDURES  Imaging and Procedures for 02/05/21  Intravitreal Injection, Pharmacologic Agent - OD - Right Eye       Time Out 02/05/2021. 11:21 AM. Confirmed correct patient, procedure, site, and patient consented.   Anesthesia Topical anesthesia was used. Anesthetic medications included Akten 3.5%.   Procedure Preparation included Tobramycin 0.3%, 10% betadine to eyelids, 5% betadine to ocular surface. A 30 gauge needle was used.   Injection: 2 mg aflibercept 2 MG/0.05ML   Route: Intravitreal, Site: Right Eye   NDC: [id number], Lot: [id number], Waste: 0 mL   Post-op Post injection exam found visual acuity of at least counting fingers. The patient tolerated the procedure well. There were no complications. The patient received written and verbal post procedure care education. Post injection medications included ocuflox.      Color Fundus Photography Optos - OU - Both Eyes       Right Eye Disc findings include normal observations. Macula : geographic atrophy, drusen. Vessels : normal observations. Periphery : normal observations.   Left Eye Progression has no prior data. Disc findings include normal observations. Macula : drusen, geographic atrophy. Vessels : normal observations. Periphery : normal observations.   Notes OD with central disciform scar, white scar no active edges OS, with pigmented RPE mottling and atrophy and soft hard drusen     OCT, Retina - OU - Both Eyes       Right Eye Quality was borderline. Scan locations included subfoveal. Progression has improved. Findings include subretinal scarring, disciform scar, abnormal foveal contour, intraretinal fluid, subretinal fluid.   Left Eye Quality was good. Scan locations included  subfoveal. Central Foveal Thickness: 246. Progression has been stable. Findings include abnormal foveal contour, retinal drusen , no IRF, no SRF.   Notes No signs of CNVM left eye  OD vastly improved  macular thickness as compared to onset of disease December 2019 as well as no active CNVM at 44-month interval.  Slightly less subretinal fluid and slight amount of intraretinal fluid remains OD but stabilized, and baseline measurements by computer abnormal thus measurements on the map are inaccurate, overall no change             ASSESSMENT/PLAN:  Exudative age-related macular degeneration of right eye with active choroidal neovascularization (HCC) Chronic subfoveal disciform scar treatment is intended to halt scotoma growth and edge of lesion remained stable at 66-month interval post Eylea.  Repeat injection today follow-up next in 4 months  Nuclear sclerotic cataract of right eye Mild to moderate not impactful on the vision  Nuclear sclerotic cataract of left eye Mild to moderate OS  Intermediate stage nonexudative age-related macular degeneration of left eye No sign of CNVM OS     ICD-10-CM   1. Exudative age-related macular degeneration of right eye with active choroidal neovascularization (HCC)  H35.3211 Intravitreal Injection, Pharmacologic Agent - OD - Right Eye    Color Fundus Photography Optos - OU - Both Eyes    OCT, Retina - OU - Both Eyes    aflibercept (EYLEA) SOLN 2 mg    2. Nuclear sclerotic cataract of right eye  H25.11     3. Nuclear sclerotic cataract of left eye  H25.12     4. 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  OD, chronic subfoveal disciform scar accounts for acuity.  No enlargement of of the lesion growth thus protection of scotoma growth by use of maintenance therapy intravitreal Eylea.  Currently at 27-month interval.  Pete injection today evaluate next in 4 months  2.  No sign of CNVM OS  3.  Ophthalmic Meds Ordered this visit:  Meds ordered this encounter  Medications   aflibercept (EYLEA) SOLN 2 mg       Return in about 4 months (around 06/05/2021) for DILATE OU, EYLEA OCT,  OD.  There are no Patient Instructions on file for this visit.   Explained the diagnoses, plan, and follow up with the patient and they expressed understanding.  Patient expressed understanding of the importance of proper follow up care.   Clent Demark Edwing Figley M.D. Diseases & Surgery of the Retina and Vitreous [facility] [place] 02/05/21     Abbreviations: M myopia (nearsighted); A astigmatism; H hyperopia (farsighted); P presbyopia; Mrx spectacle prescription;  CTL contact lenses; OD right eye; OS left eye; OU both eyes  XT exotropia; ET esotropia; PEK punctate epithelial keratitis; PEE punctate epithelial erosions; DES dry eye syndrome; MGD meibomian gland dysfunction; ATs artificial tears; PFAT's preservative free artificial tears; [place] nuclear sclerotic cataract; PSC posterior subcapsular cataract; ERM epi-retinal membrane; PVD posterior vitreous detachment; RD retinal detachment; DM diabetes mellitus; DR diabetic retinopathy; NPDR non-proliferative diabetic retinopathy; PDR proliferative diabetic retinopathy; CSME clinically significant macular edema; DME diabetic macular edema; dbh dot blot hemorrhages; CWS cotton wool spot; POAG primary open angle glaucoma; C/D cup-to-disc ratio; HVF humphrey visual field; GVF goldmann visual field; OCT optical coherence tomography; IOP intraocular pressure; BRVO Branch retinal vein occlusion; CRVO central retinal vein occlusion; CRAO central retinal artery occlusion; BRAO branch retinal artery occlusion; RT retinal tear; SB  scleral buckle; PPV pars plana vitrectomy; VH Vitreous hemorrhage; PRP panretinal laser photocoagulation; IVK intravitreal kenalog; VMT vitreomacular traction; MH Macular hole;  NVD neovascularization of the disc; NVE neovascularization elsewhere; AREDS age related eye disease study; ARMD age related macular degeneration; POAG primary open angle glaucoma; EBMD epithelial/anterior basement membrane dystrophy; ACIOL anterior chamber  intraocular lens; IOL intraocular lens; PCIOL posterior chamber intraocular lens; Phaco/IOL phacoemulsification with intraocular lens placement; [place] photorefractive keratectomy; LASIK laser assisted in situ keratomileusis; HTN hypertension; DM diabetes mellitus; COPD chronic obstructive pulmonary disease

## 2021-02-05 NOTE — Assessment & Plan Note (Signed)
No sign of CNVM OS 

## 2021-02-05 NOTE — Assessment & Plan Note (Signed)
Chronic subfoveal disciform scar treatment is intended to halt scotoma growth and edge of lesion remained stable at 60-month interval post Eylea.  Repeat injection today follow-up next in 4 months

## 2021-02-05 NOTE — Assessment & Plan Note (Signed)
Mild to moderate OS 

## 2021-02-05 NOTE — Assessment & Plan Note (Signed)
Mild to moderate not impactful on the vision

## 2021-02-07 ENCOUNTER — Other Ambulatory Visit: Payer: Self-pay | Admitting: Family Medicine

## 2021-02-07 ENCOUNTER — Other Ambulatory Visit (HOSPITAL_COMMUNITY): Payer: Self-pay

## 2021-02-07 MED ORDER — FLUNISOLIDE 25 MCG/ACT (0.025%) NA SOLN
NASAL | 99 refills | Status: DC
  Filled 2021-02-07: qty 25, 30d supply, fill #0
  Filled 2021-03-24: qty 25, 25d supply, fill #0

## 2021-02-07 MED ORDER — GABAPENTIN 800 MG PO TABS
ORAL_TABLET | Freq: Four times a day (QID) | ORAL | 11 refills | Status: DC
  Filled 2021-02-07: qty 120, 30d supply, fill #0
  Filled 2021-03-24: qty 120, 30d supply, fill #1
  Filled 2021-05-31: qty 120, 30d supply, fill #2
  Filled 2021-06-25: qty 120, 30d supply, fill #3
  Filled 2021-07-29: qty 120, 30d supply, fill #4
  Filled 2021-09-10: qty 120, 30d supply, fill #5
  Filled 2021-10-08: qty 120, 30d supply, fill #6
  Filled 2021-11-18: qty 120, 30d supply, fill #7
  Filled 2021-12-15: qty 120, 30d supply, fill #8

## 2021-02-10 ENCOUNTER — Other Ambulatory Visit (HOSPITAL_COMMUNITY): Payer: Self-pay

## 2021-02-19 ENCOUNTER — Other Ambulatory Visit (HOSPITAL_COMMUNITY): Payer: Self-pay

## 2021-03-24 ENCOUNTER — Other Ambulatory Visit (HOSPITAL_COMMUNITY): Payer: Self-pay

## 2021-04-17 DIAGNOSIS — Z23 Encounter for immunization: Secondary | ICD-10-CM | POA: Diagnosis not present

## 2021-04-29 ENCOUNTER — Ambulatory Visit (INDEPENDENT_AMBULATORY_CARE_PROVIDER_SITE_OTHER): Payer: Medicare PPO | Admitting: Family Medicine

## 2021-04-29 DIAGNOSIS — Z23 Encounter for immunization: Secondary | ICD-10-CM

## 2021-05-09 NOTE — Progress Notes (Signed)
Seen in the nurse clinic for vaccination. ?She was not evaluated by a provider during this visit. ?

## 2021-05-16 ENCOUNTER — Other Ambulatory Visit (HOSPITAL_COMMUNITY): Payer: Self-pay

## 2021-05-16 DIAGNOSIS — H524 Presbyopia: Secondary | ICD-10-CM | POA: Diagnosis not present

## 2021-05-16 DIAGNOSIS — H2513 Age-related nuclear cataract, bilateral: Secondary | ICD-10-CM | POA: Diagnosis not present

## 2021-05-16 DIAGNOSIS — H353122 Nonexudative age-related macular degeneration, left eye, intermediate dry stage: Secondary | ICD-10-CM | POA: Diagnosis not present

## 2021-05-16 DIAGNOSIS — H353211 Exudative age-related macular degeneration, right eye, with active choroidal neovascularization: Secondary | ICD-10-CM | POA: Diagnosis not present

## 2021-05-16 MED ORDER — MOXIFLOXACIN HCL 0.5 % OP SOLN
OPHTHALMIC | 0 refills | Status: DC
  Filled 2021-05-16: qty 3, 20d supply, fill #0

## 2021-05-16 MED ORDER — PREDNISOLONE ACETATE 1 % OP SUSP
OPHTHALMIC | 0 refills | Status: DC
  Filled 2021-05-16: qty 10, 40d supply, fill #0

## 2021-05-22 DIAGNOSIS — H25812 Combined forms of age-related cataract, left eye: Secondary | ICD-10-CM | POA: Diagnosis not present

## 2021-05-22 DIAGNOSIS — H2512 Age-related nuclear cataract, left eye: Secondary | ICD-10-CM | POA: Diagnosis not present

## 2021-05-31 ENCOUNTER — Other Ambulatory Visit (HOSPITAL_COMMUNITY): Payer: Self-pay

## 2021-05-31 MED ORDER — MOXIFLOXACIN HCL 0.5 % OP SOLN
1.0000 [drp] | Freq: Four times a day (QID) | OPHTHALMIC | 1 refills | Status: DC
  Filled 2021-05-31: qty 3, 10d supply, fill #0
  Filled 2021-07-29: qty 3, 10d supply, fill #1

## 2021-05-31 MED ORDER — PREDNISOLONE ACETATE 1 % OP SUSP
OPHTHALMIC | 0 refills | Status: DC
  Filled 2021-05-31: qty 10, 20d supply, fill #0

## 2021-06-05 ENCOUNTER — Encounter (INDEPENDENT_AMBULATORY_CARE_PROVIDER_SITE_OTHER): Payer: Medicare PPO | Admitting: Ophthalmology

## 2021-06-06 ENCOUNTER — Other Ambulatory Visit (HOSPITAL_COMMUNITY): Payer: Self-pay

## 2021-06-10 ENCOUNTER — Encounter: Payer: Self-pay | Admitting: *Deleted

## 2021-06-12 ENCOUNTER — Encounter (INDEPENDENT_AMBULATORY_CARE_PROVIDER_SITE_OTHER): Payer: Medicare PPO | Admitting: Ophthalmology

## 2021-06-25 ENCOUNTER — Other Ambulatory Visit (HOSPITAL_COMMUNITY): Payer: Self-pay

## 2021-06-26 DIAGNOSIS — H2511 Age-related nuclear cataract, right eye: Secondary | ICD-10-CM | POA: Diagnosis not present

## 2021-06-26 DIAGNOSIS — H269 Unspecified cataract: Secondary | ICD-10-CM | POA: Diagnosis not present

## 2021-07-01 ENCOUNTER — Encounter (INDEPENDENT_AMBULATORY_CARE_PROVIDER_SITE_OTHER): Payer: Medicare PPO | Admitting: Ophthalmology

## 2021-07-03 ENCOUNTER — Ambulatory Visit (INDEPENDENT_AMBULATORY_CARE_PROVIDER_SITE_OTHER): Payer: Medicare PPO | Admitting: Ophthalmology

## 2021-07-03 DIAGNOSIS — H353211 Exudative age-related macular degeneration, right eye, with active choroidal neovascularization: Secondary | ICD-10-CM

## 2021-07-03 MED ORDER — AFLIBERCEPT 2MG/0.05ML IZ SOLN FOR KALEIDOSCOPE
2.0000 mg | INTRAVITREAL | Status: AC | PRN
  Administered 2021-07-03: 2 mg via INTRAVITREAL

## 2021-07-03 NOTE — Progress Notes (Signed)
07/03/2021     CHIEF COMPLAINT Patient presents for  Chief Complaint  Patient presents with   Macular Degeneration      HISTORY OF PRESENT ILLNESS: Teresa Shaffer is a 81 y.o. female who presents to the clinic today for:   HPI   4 MOS for DILATE OU, EYLEA, OCT, OD. Pt stated left eye has improved and peripheral in the right eye has gotten better. Pt reports that there is still a spot in the center of right eye.  Last edited by Silvestre Moment on 07/03/2021  3:40 PM.      Referring physician: McDiarmid, Blane Ohara, MD [place],  [place] [address]  HISTORICAL INFORMATION:   Selected notes from the MEDICAL RECORD NUMBER    Lab Results  Component Value Date   HGBA1C 6.3 08/22/2020     CURRENT MEDICATIONS: Current Outpatient Medications (Ophthalmic Drugs)  Medication Sig   Aflibercept (EYLEA) 2 MG/0.05ML SOLN by Intravitreal route every 3 (three) months.   moxifloxacin (VIGAMOX) 0.5 % ophthalmic solution Place 1 drop into the right eye 4 (four) times daily, starting 2 days prior to surgery and 2 drops the morning of surgery   prednisoLONE acetate (PRED FORTE) 1 % ophthalmic suspension Place 1 drop to right eye 4 times a day   No current facility-administered medications for this visit. (Ophthalmic Drugs)   Current Outpatient Medications (Other)  Medication Sig   Calcium Carbonate-Vitamin D 600-400 MG-UNIT tablet Take 1 tablet by mouth daily.   cetirizine (ZYRTEC) 10 MG tablet Take 10 mg by mouth daily.   flunisolide (NASALIDE) 25 MCG/ACT (0.025%) SOLN PLACE 2 SPRAYS INTO EACH NOSTRIL 2 TIMES DAILY AS NEEDED   gabapentin (NEURONTIN) 800 MG tablet TAKE 1 TABLET BY MOUTH 4 TIMES DAILY   hydrocortisone cream 1 % Apply 1 application topically daily. Apply to face   magnesium oxide (MAG-OX) 400 MG tablet Take 400 mg by mouth daily.   Multiple Vitamin (MULTIVITAMINS PO) Take 1 tablet by mouth daily.   Multiple Vitamins-Minerals (PRESERVISION AREDS) TABS Take 1  capsule by mouth in the morning and at bedtime.   No current facility-administered medications for this visit. (Other)      REVIEW OF SYSTEMS: ROS   Negative for: Constitutional, Gastrointestinal, Neurological, Skin, Genitourinary, Musculoskeletal, HENT, Endocrine, Cardiovascular, Eyes, Respiratory, Psychiatric, Allergic/Imm, Heme/Lymph Last edited by Silvestre Moment on 07/03/2021  3:40 PM.       ALLERGIES No Known Allergies  PAST MEDICAL HISTORY Past Medical History:  Diagnosis Date   Adhesive capsulitis of right shoulder 10/25/2014   S/P right rotator cuff repair    Advanced nonexudative age-related macular degeneration of right eye with subfoveal involvement 12/27/2019   Allergic reaction 02/07/2018   Allergic rhinoconjunctivitis 08/05/2017   Cerumen impaction 09/02/2012   Chest pain 04/24/2013   Complete rotator cuff tear 02/16/2013   On scan today she appears to have the anterior and superior capsule torn away with retraction    Degenerative joint disease of knee, left 08/04/2011   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 Minimal DJD changes on xray. Sunrise view wasn't done.     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 Not in range to require treatment    Intermediate stage nonexudative age-related macular degeneration of both eyes 05/03/2019   The nature of dry age related macular degeneration was discussed with the patient as well as its possible conversion to wet. The results of the AREDS 2 study was discussed with the  patient. A diet rich in dark leafy green vegetables was advised and specific recommendations were made regarding supplements with AREDS 2 formulation . Control of hypertension and serum cholesterol may slow the disease.   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 Minimal DJD changes on xray. Sunrise view wasn't done.     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 Magod MD [facility]) - diagnostic colonoscopy for hematochezia   Unsteady gait 09/02/2012   Valgus deformity of great toes, bilateral 08/28/2016   Past Surgical History:  Procedure Laterality Date   BREAST CYST ASPIRATION  1999   benign fibrocystic disease   COLONOSCOPY  09/2014   No polyps, Dr Cleaster Corin.    COLONOSCOPY W/ BIOPSIES AND POLYPECTOMY  12/2003   tubular adenoma   ENDOMETRIAL BIOPSY     benign   ROTATOR CUFF REPAIR Right    Dr Mardelle Matte (Ortho)   TONSILLECTOMY  1953   TOTAL SHOULDER ARTHROPLASTY Left 08/06/2020   Procedure: TOTAL SHOULDER ARTHROPLASTY;  Surgeon: Marchia Bond, MD;  Location: [facility];  Service: Orthopedics;  Laterality: Left;   TYMPANOPLASTY WITH GRAFT Left 11/17/2016   Surgeon: Vicie Mutters MD (ENT)      FAMILY HISTORY Family History  Problem Relation Age of Onset   Neuropathy Father    Diabetes Father    Coronary artery disease Father    Neuropathy Brother    Neuropathy Brother    Diabetes Brother     SOCIAL HISTORY Social History   Tobacco Use   Smoking status: Never   Smokeless tobacco: Never  Vaping Use   Vaping Use: Never used  Substance Use Topics   Alcohol use: Not Currently    Comment: rare alcohol   Drug use: No         OPHTHALMIC EXAM:  Base Eye Exam     Visual Acuity (ETDRS)       Right Left   Dist cc CF at 3' 20/40 -1   Dist ph cc  NI    Correction: Glasses         Tonometry (Tonopen, 3:46 PM)       Right Left   Pressure 11 13  Pupils       Pupils APD   Right PERRL None   Left PERRL None         Visual Fields       Left Right    Full Full         Extraocular Movement       Right Left    Full Full         Neuro/Psych     Oriented x3: Yes   Mood/Affect: Normal         Dilation     Both eyes: 1.0% Mydriacyl, 2.5% Phenylephrine @ 3:46 PM           Slit Lamp and Fundus Exam     External Exam       Right Left   External Normal Normal         Slit Lamp Exam       Right Left   Lids/Lashes Normal Normal   Conjunctiva/Sclera White and quiet White and quiet   Cornea Clear Clear   Anterior Chamber Deep and quiet Deep and quiet   Iris Round and reactive Round and reactive   Lens 3+ Nuclear sclerosis 3+ Nuclear sclerosis   Anterior Vitreous Normal Normal         Fundus Exam       Right Left   Posterior Vitreous Posterior vitreous detachment Posterior vitreous detachment   Disc Normal Normal   C/D Ratio 0.2 0.3   Macula Disciform scar, Hard drusen, no exudates, Macular thickening, no hemorrhage, Retinal pigment epithelial mottling, Advanced age related macular degeneration, Choroidal nevus no change, Geographic atrophy into the FAZ, active PED, and sr heme temp to scar Drusen, Soft  drusen, Intermediate age related macular degeneration, no membrane, no hemorrhage, , Retinal pigment epithelial mottling   Vessels Normal Normal   Periphery Normal Normal            IMAGING AND PROCEDURES  Imaging and Procedures for 07/03/21  OCT, Retina - OU - Both Eyes       Right Eye Quality was borderline. Scan locations included subfoveal. Progression has worsened. Findings include abnormal foveal contour, disciform scar, intraretinal fluid, subretinal fluid, subretinal scarring.   Left Eye Quality was good. Scan locations included subfoveal. Central Foveal Thickness: 246. Progression has been stable. Findings include no IRF, no SRF, abnormal foveal contour, retinal drusen .   Notes No signs of CNVM left eye  OD vastly improved macular thickness as compared to onset of disease December 2019 as well as no active CNVM at 55-monthinterval.  Slightly less subretinal fluid and slight amount of intraretinal fluid remains OD but stabilized, and baseline measurements by computer abnormal thus measurements on the map are inaccurate, overall no change  OD however has large subretinal hemorrhage temporal aspect disciform scar active disease repeat injection today follow-up next in 8 weeks     Intravitreal Injection, Pharmacologic Agent - OD - Right Eye       Time Out 07/03/2021. 4:30 PM. Confirmed correct patient, procedure, site, and patient consented.   Anesthesia Topical anesthesia was used. Anesthetic medications included Akten 3.5%.   Procedure Preparation included 5% betadine to ocular surface, 10% betadine to eyelids, Tobramycin 0.3%. A 30 gauge needle was used.   Injection: 2 mg aflibercept 2 MG/0.05ML   Route: Intravitreal, Site: Right Eye   NDC: [id number] Lot: [id number] Expiration date: 03/06/2022, Waste: 0 mL   Post-op Post injection exam found visual acuity of at least counting fingers.  The patient tolerated the procedure well. There were no complications.  The patient received written and verbal post procedure care education. Post injection medications included ocuflox.              ASSESSMENT/PLAN:  Exudative age-related macular degeneration of right eye with active choroidal neovascularization (HCC) OD with subfoveal disciform now with active PED and hemorrhagic condition temporally, threatening enlargement of scotoma 69-monthfollow-up.  Repeat injection today Eylea, follow-up next  8 weeks     ICD-10-CM   1. Exudative age-related macular degeneration of right eye with active choroidal neovascularization (HCC)  H35.3211 OCT, Retina - OU - Both Eyes    Intravitreal Injection, Pharmacologic Agent - OD - Right Eye    aflibercept (EYLEA) SOLN 2 mg        2.  3.  Ophthalmic Meds Ordered this visit:  Meds ordered this encounter  Medications   aflibercept (EYLEA) SOLN 2 mg       Return in about 8 weeks (around 08/28/2021) for dilate, OD, EYLEA OCT.  There are no Patient Instructions on file for this visit.   Explained the diagnoses, plan, and follow up with the patient and they expressed understanding.  Patient expressed understanding of the importance of proper follow up care.   GClent DemarkRankin M.D. Diseases & Surgery of the Retina and Vitreous Retina & Diabetic [place]06/29/23     Abbreviations: M myopia (nearsighted); A astigmatism; H hyperopia (farsighted); P presbyopia; Mrx spectacle prescription;  CTL contact lenses; OD right eye; OS left eye; OU both eyes  XT exotropia; ET esotropia; PEK punctate epithelial keratitis; PEE punctate epithelial erosions; DES dry eye syndrome; MGD meibomian gland dysfunction; ATs artificial tears; PFAT's preservative free artificial tears; NAddisonnuclear sclerotic cataract; PSC posterior subcapsular cataract; ERM epi-retinal membrane; PVD posterior vitreous detachment; RD retinal detachment; DM diabetes mellitus; DR diabetic retinopathy; NPDR non-proliferative diabetic retinopathy; PDR  proliferative diabetic retinopathy; CSME clinically significant macular edema; DME diabetic macular edema; dbh dot blot hemorrhages; CWS cotton wool spot; POAG primary open angle glaucoma; C/D cup-to-disc ratio; HVF humphrey visual field; GVF goldmann visual field; OCT optical coherence tomography; IOP intraocular pressure; BRVO Branch retinal vein occlusion; CRVO central retinal vein occlusion; CRAO central retinal artery occlusion; BRAO branch retinal artery occlusion; RT retinal tear; SB scleral buckle; PPV pars plana vitrectomy; VH Vitreous hemorrhage; PRP panretinal laser photocoagulation; IVK intravitreal kenalog; VMT vitreomacular traction; MH Macular hole;  NVD neovascularization of the disc; NVE neovascularization elsewhere; AREDS age related eye disease study; ARMD age related macular degeneration; POAG primary open angle glaucoma; EBMD epithelial/anterior basement membrane dystrophy; ACIOL anterior chamber intraocular lens; IOL intraocular lens; PCIOL posterior chamber intraocular lens; Phaco/IOL phacoemulsification with intraocular lens placement; PMayerphotorefractive keratectomy; LASIK laser assisted in situ keratomileusis; HTN hypertension; DM diabetes mellitus; COPD chronic obstructive pulmonary disease

## 2021-07-03 NOTE — Assessment & Plan Note (Addendum)
OD with subfoveal disciform now with active PED and hemorrhagic condition temporally, threatening enlargement of scotoma 34-monthfollow-up.  Repeat injection today Eylea, follow-up next  8 weeks

## 2021-07-07 ENCOUNTER — Encounter (INDEPENDENT_AMBULATORY_CARE_PROVIDER_SITE_OTHER): Payer: Self-pay | Admitting: Ophthalmology

## 2021-07-29 ENCOUNTER — Other Ambulatory Visit (HOSPITAL_COMMUNITY): Payer: Self-pay

## 2021-08-05 ENCOUNTER — Other Ambulatory Visit (HOSPITAL_COMMUNITY): Payer: Self-pay

## 2021-08-05 DIAGNOSIS — H353211 Exudative age-related macular degeneration, right eye, with active choroidal neovascularization: Secondary | ICD-10-CM | POA: Diagnosis not present

## 2021-08-05 DIAGNOSIS — H353133 Nonexudative age-related macular degeneration, bilateral, advanced atrophic without subfoveal involvement: Secondary | ICD-10-CM | POA: Diagnosis not present

## 2021-08-19 ENCOUNTER — Ambulatory Visit: Payer: Self-pay

## 2021-08-19 NOTE — Patient Outreach (Signed)
  Care Coordination   Initial Visit Note   08/19/2021 Name: Teresa Shaffer MRN: [id number] DOB: 03-07-1940  Teresa Shaffer is a 81 y.o. year old female who sees McDiarmid, Blane Ohara, MD for primary care. I spoke with  [name] by phone today  What matters to the patients health and wellness today?  "I am doing fine, I  do not need any assistance.  I am going to call for an appointment."    Goals Addressed   None     SDOH assessments and interventions completed:  Yes     Care Coordination Interventions Activated:  No  Care Coordination Interventions:  No, not indicated   Follow up plan: No further intervention required.   Encounter Outcome:  Pt. Visit Completed

## 2021-08-20 ENCOUNTER — Other Ambulatory Visit (HOSPITAL_COMMUNITY): Payer: Self-pay

## 2021-08-20 MED ORDER — AMOXICILLIN 500 MG PO CAPS
500.0000 mg | ORAL_CAPSULE | Freq: Three times a day (TID) | ORAL | 0 refills | Status: DC
  Filled 2021-08-20: qty 21, 7d supply, fill #0

## 2021-08-22 ENCOUNTER — Other Ambulatory Visit (HOSPITAL_COMMUNITY): Payer: Self-pay

## 2021-08-28 ENCOUNTER — Encounter (INDEPENDENT_AMBULATORY_CARE_PROVIDER_SITE_OTHER): Payer: Medicare PPO | Admitting: Ophthalmology

## 2021-09-04 ENCOUNTER — Encounter (INDEPENDENT_AMBULATORY_CARE_PROVIDER_SITE_OTHER): Payer: Medicare PPO | Admitting: Ophthalmology

## 2021-09-10 ENCOUNTER — Other Ambulatory Visit (HOSPITAL_COMMUNITY): Payer: Self-pay

## 2021-09-11 ENCOUNTER — Encounter (INDEPENDENT_AMBULATORY_CARE_PROVIDER_SITE_OTHER): Payer: Self-pay | Admitting: Ophthalmology

## 2021-09-11 ENCOUNTER — Ambulatory Visit (INDEPENDENT_AMBULATORY_CARE_PROVIDER_SITE_OTHER): Payer: Medicare PPO | Admitting: Ophthalmology

## 2021-09-11 DIAGNOSIS — H353211 Exudative age-related macular degeneration, right eye, with active choroidal neovascularization: Secondary | ICD-10-CM | POA: Diagnosis not present

## 2021-09-11 DIAGNOSIS — H353122 Nonexudative age-related macular degeneration, left eye, intermediate dry stage: Secondary | ICD-10-CM | POA: Diagnosis not present

## 2021-09-11 DIAGNOSIS — Z961 Presence of intraocular lens: Secondary | ICD-10-CM | POA: Insufficient documentation

## 2021-09-11 DIAGNOSIS — H43813 Vitreous degeneration, bilateral: Secondary | ICD-10-CM

## 2021-09-11 HISTORY — DX: Presence of intraocular lens: Z96.1

## 2021-09-11 MED ORDER — AFLIBERCEPT 2MG/0.05ML IZ SOLN FOR KALEIDOSCOPE
2.0000 mg | INTRAVITREAL | Status: AC | PRN
  Administered 2021-09-11: 2 mg via INTRAVITREAL

## 2021-09-11 NOTE — Assessment & Plan Note (Signed)
Stable OU 

## 2021-09-11 NOTE — Assessment & Plan Note (Signed)
Stable

## 2021-09-11 NOTE — Assessment & Plan Note (Signed)
No sign of CNVM OS patient to notify us promptly new onset visual acuity decline or distortion OS

## 2021-09-11 NOTE — Assessment & Plan Note (Signed)
OD much improved macular condition and prevention of scotoma enlargement with recent therapy intravitreal Eylea 8 weeks previous.  We will repeat injection today to maintain and reevaluate next in 10 weeks given the limitation of acuity

## 2021-09-11 NOTE — Progress Notes (Signed)
09/11/2021     CHIEF COMPLAINT Patient presents for  Chief Complaint  Patient presents with   Macular Degeneration      HISTORY OF PRESENT ILLNESS: Teresa Shaffer is a 81 y.o. female who presents to the clinic today for:   HPI   8 weeks dilate od eylea oct Pt states her vision has been stable Pt denies any new floaters or FOL Last edited by Morene Rankins, CMA on 09/11/2021  1:01 PM.      Referring physician: McDiarmid, Blane Ohara, MD [place],  [place] [address]  HISTORICAL INFORMATION:   Selected notes from the [facility]    Lab Results  Component Value Date   HGBA1C 6.3 08/22/2020     CURRENT MEDICATIONS: Current Outpatient Medications (Ophthalmic Drugs)  Medication Sig   Aflibercept (EYLEA) 2 MG/0.05ML SOLN by Intravitreal route every 3 (three) months.   moxifloxacin (VIGAMOX) 0.5 % ophthalmic solution Place 1 drop into the right eye 4 (four) times daily, starting 2 days prior to surgery and 2 drops the morning of surgery   prednisoLONE acetate (PRED FORTE) 1 % ophthalmic suspension Place 1 drop to right eye 4 times a day   No current facility-administered medications for this visit. (Ophthalmic Drugs)   Current Outpatient Medications (Other)  Medication Sig   amoxicillin (AMOXIL) 500 MG capsule Take 1 capsule (500 mg total) by mouth every 8 (eight) hours until finished   Calcium Carbonate-Vitamin D 600-400 MG-UNIT tablet Take 1 tablet by mouth daily.   cetirizine (ZYRTEC) 10 MG tablet Take 10 mg by mouth daily.   flunisolide (NASALIDE) 25 MCG/ACT (0.025%) SOLN PLACE 2 SPRAYS INTO EACH NOSTRIL 2 TIMES DAILY AS NEEDED   gabapentin (NEURONTIN) 800 MG tablet TAKE 1 TABLET BY MOUTH 4 TIMES DAILY   hydrocortisone cream 1 % Apply 1 application topically daily. Apply to face   magnesium oxide (MAG-OX) 400 MG tablet Take 400 mg by mouth daily.   Multiple Vitamin (MULTIVITAMINS PO) Take 1 tablet by mouth daily.   Multiple  Vitamins-Minerals (PRESERVISION AREDS) TABS Take 1 capsule by mouth in the morning and at bedtime.   No current facility-administered medications for this visit. (Other)      REVIEW OF SYSTEMS: ROS   Negative for: Constitutional, Gastrointestinal, Neurological, Skin, Genitourinary, Musculoskeletal, HENT, Endocrine, Cardiovascular, Eyes, Respiratory, Psychiatric, Allergic/Imm, Heme/Lymph Last edited by Orene Desanctis D, CMA on 09/11/2021  1:01 PM.       ALLERGIES No Known Allergies  PAST MEDICAL HISTORY Past Medical History:  Diagnosis Date   Adhesive capsulitis of right shoulder 10/25/2014   S/P right rotator cuff repair    Advanced nonexudative age-related macular degeneration of right eye with subfoveal involvement 12/27/2019   Allergic reaction 02/07/2018   Allergic rhinoconjunctivitis 08/05/2017   Cerumen impaction 09/02/2012   Chest pain 04/24/2013   Complete rotator cuff tear 02/16/2013   On scan today she appears to have the anterior and superior capsule torn away with retraction    Degenerative joint disease of knee, left 08/04/2011   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 Minimal DJD changes on xray. Sunrise view wasn't done.     Degenerative retinal drusen of left eye 05/03/2019   DNR (do not resuscitate) 12/18/2014   Teresa Shaffer has an authorized Transport DNR (Yellow DNR) document dated 12/06/14   FIBROADENOSIS, BREAST 03/04/2006   Qualifier: Diagnosis of  By: Eusebio Friendly     Fibrocystic breast changes, bilateral 08/28/2016   Has had cyst  aspiration in past   Frequent stools 08/06/2017   Frozen shoulder 10/25/2014   Herpes simplex type 2 infection 08/20/2011   Hypertriglyceridemia 08/20/2011   TG 265 on 08/2011. Not in range to require treatment    Intermediate stage nonexudative age-related macular degeneration of both eyes 05/03/2019   The nature of dry age related macular degeneration was discussed with the patient as well as its possible conversion  to wet. The results of the AREDS 2 study was discussed with the patient. A diet rich in dark leafy green vegetables was advised and specific recommendations were made regarding supplements with AREDS 2 formulation . Control of hypertension and serum cholesterol may slow the disease.   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 Minimal DJD changes on xray. Sunrise view wasn't done.     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 Magod MD [facility]) - diagnostic colonoscopy for hematochezia   Unsteady gait 09/02/2012   Valgus deformity of great toes, bilateral 08/28/2016   Past Surgical History:  Procedure Laterality Date   BREAST CYST ASPIRATION  1999   benign fibrocystic disease   COLONOSCOPY  09/2014   No polyps, Dr Cleaster Corin.    COLONOSCOPY W/ BIOPSIES AND POLYPECTOMY  12/2003   tubular adenoma   ENDOMETRIAL BIOPSY     benign   ROTATOR CUFF REPAIR Right    Dr Mardelle Matte (Ortho)   TONSILLECTOMY  1953   TOTAL SHOULDER ARTHROPLASTY Left 08/06/2020   Procedure: TOTAL SHOULDER ARTHROPLASTY;  Surgeon:  Marchia Bond, MD;  Location: [facility];  Service: Orthopedics;  Laterality: Left;   TYMPANOPLASTY WITH GRAFT Left 11/17/2016   Surgeon: Vicie Mutters MD (ENT)     FAMILY HISTORY Family History  Problem Relation Age of Onset   Neuropathy Father    Diabetes Father    Coronary artery disease Father    Neuropathy Brother    Neuropathy Brother    Diabetes Brother     SOCIAL HISTORY Social History   Tobacco Use   Smoking status: Never   Smokeless tobacco: Never  Vaping Use   Vaping Use: Never used  Substance Use Topics   Alcohol use: Not Currently    Comment: rare alcohol   Drug use: No         OPHTHALMIC EXAM:  Base Eye Exam     Visual Acuity (ETDRS)       Right Left   Dist [place] CF at 3' 20/30 +3         Tonometry (Tonopen, 1:06 PM)  Right Left   Pressure 5 7         Pupils       Pupils   Right PERRL   Left PERRL         Extraocular Movement       Right Left    Ortho Ortho    -- -- --  --  --  -- -- --   -- -- --  --  --  -- -- --           Neuro/Psych     Oriented x3: Yes   Mood/Affect: Normal         Dilation     Right eye: 2.5% Phenylephrine, 1.0% Mydriacyl @ 1:03 PM           Slit Lamp and Fundus Exam     External Exam       Right Left   External Normal Normal         Slit Lamp Exam       Right Left   Lids/Lashes Normal Normal   Conjunctiva/Sclera White and quiet White and quiet   Cornea Clear Clear   Anterior Chamber Deep and quiet Deep and quiet   Iris Round and reactive Round and reactive   Lens Centered posterior chamber intraocular lens Centered posterior chamber intraocular lens   Anterior Vitreous Normal Normal         Fundus Exam       Right Left   Posterior Vitreous Posterior vitreous detachment Posterior vitreous detachment   Disc Normal Normal   C/D Ratio 0.2 0.3   Macula Disciform scar, Hard drusen, no exudates, Macular thickening, no hemorrhage, Retinal pigment epithelial mottling,  Advanced age related macular degeneration, Choroidal nevus no change, Geographic atrophy into the FAZ, active PED, and sr heme temp to scar Drusen, Soft drusen, Intermediate age related macular degeneration, no membrane, no hemorrhage, , Retinal pigment epithelial mottling   Vessels Normal Normal   Periphery Normal Normal            IMAGING AND PROCEDURES  Imaging and Procedures for 09/11/21  OCT, Retina - OU - Both Eyes       Right Eye Quality was borderline. Scan locations included subfoveal. Central Foveal Thickness: 261. Progression has worsened. Findings include abnormal foveal contour, disciform scar, intraretinal fluid, subretinal fluid, subretinal scarring.   Left Eye Quality was good. Scan locations included subfoveal. Central Foveal Thickness: 246. Progression has been stable. Findings include no IRF, no SRF, abnormal foveal contour, retinal drusen .   Notes No signs of CNVM left eye  OD vastly improved macular thickness as compared to onset of disease December 2019 as well as no active CNVM at 79-monthinterval.  Slightly less subretinal fluid and slight amount of intraretinal fluid remains OD but stabilized, and baseline measurements by computer abnormal thus measurements on the map are inaccurate, overall no change  OD however recently had large subretinal hemorrhage temporal aspect disciform scar active disease repeat injection today for now repeat injection has halted this condition     Intravitreal Injection, Pharmacologic Agent - OD - Right Eye       Time Out 09/11/2021. 1:39 PM. Confirmed correct patient, procedure, site, and patient consented.   Anesthesia Topical anesthesia was used. Anesthetic medications included Lidocaine 4%.   Procedure Preparation included 5% betadine to ocular surface, 10% betadine to eyelids, Tobramycin 0.3%. A 30 gauge needle was used.   Injection: 2 mg aflibercept 2 MG/0.05ML   Route:  Intravitreal, Site: Right Eye   NDC:  [id number], Lot: [id number], Expiration date: 09/07/2022, Waste: 0 mL   Post-op Post injection exam found visual acuity of at least counting fingers. The patient tolerated the procedure well. There were no complications. The patient received written and verbal post procedure care education. Post injection medications included ocuflox.              ASSESSMENT/PLAN:  Intermediate stage nonexudative age-related macular degeneration of left eye No sign of CNVM OS patient to notify us promptly new onset visual acuity decline or distortion OS  Exudative age-related macular degeneration of right eye with active choroidal neovascularization ([place]) OD much improved macular condition and prevention of scotoma enlargement with recent therapy intravitreal Eylea 8 weeks previous.  We will repeat injection today to maintain and reevaluate next in 10 weeks given the limitation of acuity  Nuclear sclerotic cataract of right eye Stable  Posterior vitreous detachment of both eyes Physiologic stable OU no holes or tears  Pseudophakia, both eyes Stable OU     ICD-10-CM   1. Exudative age-related macular degeneration of right eye with active choroidal neovascularization (HCC)  H35.3211 OCT, Retina - OU - Both Eyes    Intravitreal Injection, Pharmacologic Agent - OD - Right Eye    aflibercept (EYLEA) SOLN 2 mg    2. Intermediate stage nonexudative age-related macular degeneration of left eye  H35.3122     3. Posterior vitreous detachment of both eyes  H43.813     4. Pseudophakia, both eyes  Z96.1       1.  Repeat injection intravitreal Eylea OD today to prevent scotoma enlargement at 8-week interval.  Extend interval next  2.  OU look great with cataract extraction lens implantation OU 3.  PVD no holes or tears  Ophthalmic Meds Ordered this visit:  Meds ordered this encounter  Medications   aflibercept (EYLEA) SOLN 2 mg       Return in about 10 weeks (around 11/20/2021) for DILATE OU,  EYLEA OCT, OD.  There are no Patient Instructions on file for this visit.   Explained the diagnoses, plan, and follow up with the patient and they expressed understanding.  Patient expressed understanding of the importance of proper follow up care.   Clent Demark Rilda Bulls M.D. Diseases & Surgery of the Retina and Vitreous Retina & Diabetic [place] 09/11/21     Abbreviations: M myopia (nearsighted); A astigmatism; H hyperopia (farsighted); P presbyopia; Mrx spectacle prescription;  CTL contact lenses; OD right eye; OS left eye; OU both eyes  XT exotropia; ET esotropia; PEK punctate epithelial keratitis; PEE punctate epithelial erosions; DES dry eye syndrome; MGD meibomian gland dysfunction; ATs artificial tears; PFAT's preservative free artificial tears; [place] nuclear sclerotic cataract; PSC posterior subcapsular cataract; ERM epi-retinal membrane; PVD posterior vitreous detachment; RD retinal detachment; DM diabetes mellitus; DR diabetic retinopathy; NPDR non-proliferative diabetic retinopathy; PDR proliferative diabetic retinopathy; CSME clinically significant macular edema; DME diabetic macular edema; dbh dot blot hemorrhages; CWS cotton wool spot; POAG primary open angle glaucoma; C/D cup-to-disc ratio; HVF humphrey visual field; GVF goldmann visual field; OCT optical coherence tomography; IOP intraocular pressure; BRVO Branch retinal vein occlusion; CRVO central retinal vein occlusion; CRAO central retinal artery occlusion; BRAO branch retinal artery occlusion; RT retinal tear; SB scleral buckle; PPV pars plana vitrectomy; VH Vitreous hemorrhage; PRP panretinal laser photocoagulation; IVK intravitreal kenalog; VMT vitreomacular traction; MH Macular hole;  NVD neovascularization of the disc; NVE neovascularization elsewhere; AREDS age related eye disease study; ARMD  age related macular degeneration; POAG primary open angle glaucoma; EBMD epithelial/anterior basement membrane dystrophy; ACIOL anterior  chamber intraocular lens; IOL intraocular lens; PCIOL posterior chamber intraocular lens; Phaco/IOL phacoemulsification with intraocular lens placement; [place] photorefractive keratectomy; LASIK laser assisted in situ keratomileusis; HTN hypertension; DM diabetes mellitus; COPD chronic obstructive pulmonary disease

## 2021-09-11 NOTE — Assessment & Plan Note (Signed)
Physiologic stable OU no holes or tears

## 2021-09-23 DIAGNOSIS — Z961 Presence of intraocular lens: Secondary | ICD-10-CM | POA: Diagnosis not present

## 2021-10-08 ENCOUNTER — Other Ambulatory Visit (HOSPITAL_COMMUNITY): Payer: Self-pay

## 2021-11-18 ENCOUNTER — Other Ambulatory Visit (HOSPITAL_COMMUNITY): Payer: Self-pay

## 2021-11-20 ENCOUNTER — Encounter (INDEPENDENT_AMBULATORY_CARE_PROVIDER_SITE_OTHER): Payer: Medicare PPO | Admitting: Ophthalmology

## 2021-12-08 DIAGNOSIS — H353231 Exudative age-related macular degeneration, bilateral, with active choroidal neovascularization: Secondary | ICD-10-CM | POA: Diagnosis not present

## 2021-12-08 DIAGNOSIS — H43813 Vitreous degeneration, bilateral: Secondary | ICD-10-CM | POA: Diagnosis not present

## 2021-12-08 DIAGNOSIS — H353122 Nonexudative age-related macular degeneration, left eye, intermediate dry stage: Secondary | ICD-10-CM | POA: Diagnosis not present

## 2021-12-08 DIAGNOSIS — H353221 Exudative age-related macular degeneration, left eye, with active choroidal neovascularization: Secondary | ICD-10-CM | POA: Diagnosis not present

## 2021-12-08 DIAGNOSIS — H353211 Exudative age-related macular degeneration, right eye, with active choroidal neovascularization: Secondary | ICD-10-CM | POA: Diagnosis not present

## 2021-12-15 ENCOUNTER — Other Ambulatory Visit (HOSPITAL_COMMUNITY): Payer: Self-pay

## 2021-12-26 ENCOUNTER — Telehealth: Payer: Self-pay | Admitting: Family Medicine

## 2021-12-26 NOTE — Telephone Encounter (Signed)
Spoke with patient to schedule Medicare Annual Wellness Visit (AWV) either virtually or phone  Patient wanted call back after first of year    Last AWV  05/19/21 please schedule with Nurse Health Adviser   45 min for awv-i and in office appointments 30 min for awv-s  phone/virtual appointments

## 2022-01-12 DIAGNOSIS — H353122 Nonexudative age-related macular degeneration, left eye, intermediate dry stage: Secondary | ICD-10-CM | POA: Diagnosis not present

## 2022-01-12 DIAGNOSIS — H43813 Vitreous degeneration, bilateral: Secondary | ICD-10-CM | POA: Diagnosis not present

## 2022-01-12 DIAGNOSIS — H353221 Exudative age-related macular degeneration, left eye, with active choroidal neovascularization: Secondary | ICD-10-CM | POA: Diagnosis not present

## 2022-01-15 ENCOUNTER — Encounter: Payer: Self-pay | Admitting: Family Medicine

## 2022-01-15 ENCOUNTER — Other Ambulatory Visit (HOSPITAL_COMMUNITY): Payer: Self-pay

## 2022-01-15 ENCOUNTER — Ambulatory Visit: Payer: Medicare PPO | Admitting: Family Medicine

## 2022-01-15 VITALS — BP 133/71 | HR 77 | Temp 98.4°F | Ht 60.0 in | Wt 170.0 lb

## 2022-01-15 DIAGNOSIS — J069 Acute upper respiratory infection, unspecified: Secondary | ICD-10-CM | POA: Diagnosis not present

## 2022-01-15 MED ORDER — BENZONATATE 100 MG PO CAPS
100.0000 mg | ORAL_CAPSULE | Freq: Three times a day (TID) | ORAL | 0 refills | Status: DC | PRN
  Filled 2022-01-15: qty 20, 7d supply, fill #0

## 2022-01-15 NOTE — Progress Notes (Signed)
    SUBJECTIVE:   CHIEF COMPLAINT / HPI:   Sickness - 2 days of cough and sore throat and rhinorrhea, ear pressures - Dry cough, not any worse at night - Took COVID test [facility] that was negative in the last few weeks - Denies N/V/D and fevers and stomach pains - Not taking anything OTC for her symptoms  PERTINENT  PMH / PSH: Reviewed  OBJECTIVE:   BP 133/71   Pulse 77   Temp 98.4 F (36.9 C)   Ht 5' (1.524 m)   Wt 170 lb (77.1 kg)   SpO2 97%   BMI 33.20 kg/m   Gen: well-appearing, NAD CV: RRR, no m/r/g appreciated, no peripheral edema Pulm: CTAB, no wheezes/crackles GI: soft, non-tender, non-distended HEENT: TM clear bilaterally, no pharyngeal erythema, does not have any tonsils, no cervical LAD  ASSESSMENT/PLAN:   Viral URI with cough Exam and history currently consistent with viral URI, no focality on exam to suggest developing pneumonia. Patient does not wish to be tested for COVID and Flu. No history of pulmonary disease.  - conservative management - OTC recommendations given and recommend local honey - Tessalon Perles prescription given - Discussed strict return and ER precautions    Teresa Shaffer, [place]

## 2022-01-15 NOTE — Patient Instructions (Addendum)
Right now it looks like you most likely have a viral respiratory infection. At this time you do not need anything like antibiotics. The cough can be the hardest thing to treat and can last for up to 6-8 weeks. I recommend trying over the counter medications such as Robitussin or Mucinex (NOT the DM versions) to see if that helps. Using local honey in tea or even just a small amount by spoon can help with the sore throat and cough.   I have sent a prescription called Tessalon Perles that he can use up to 3 times a day as needed for the cough.  If it does not seem to help you, then you do not have to continue taking the medication.  If it is not covered by your insurance, then I just recommend using the over-the-counter medications as we do not have much else prescription wise that is great for cough.  Patient should come back if you start to have persistent fevers or you do not feel like you are able to hydrate or feel like your cough is getting worse and becoming productive.

## 2022-01-20 ENCOUNTER — Other Ambulatory Visit: Payer: Self-pay | Admitting: Family Medicine

## 2022-01-20 ENCOUNTER — Other Ambulatory Visit (HOSPITAL_COMMUNITY): Payer: Self-pay

## 2022-01-21 ENCOUNTER — Other Ambulatory Visit (HOSPITAL_COMMUNITY): Payer: Self-pay

## 2022-01-21 MED ORDER — BENZONATATE 100 MG PO CAPS
100.0000 mg | ORAL_CAPSULE | Freq: Three times a day (TID) | ORAL | 2 refills | Status: DC | PRN
  Filled 2022-01-21: qty 20, 7d supply, fill #0

## 2022-02-12 ENCOUNTER — Other Ambulatory Visit (HOSPITAL_COMMUNITY): Payer: Self-pay

## 2022-02-12 ENCOUNTER — Other Ambulatory Visit: Payer: Self-pay | Admitting: Family Medicine

## 2022-02-12 MED ORDER — GABAPENTIN 800 MG PO TABS
800.0000 mg | ORAL_TABLET | Freq: Four times a day (QID) | ORAL | 11 refills | Status: DC
  Filled 2022-02-12: qty 120, 30d supply, fill #0
  Filled 2022-03-03 – 2022-03-07 (×2): qty 120, 30d supply, fill #1
  Filled 2022-04-14: qty 120, 30d supply, fill #2
  Filled 2022-05-12 – 2022-06-05 (×2): qty 120, 30d supply, fill #3
  Filled 2022-07-16: qty 120, 30d supply, fill #4
  Filled 2022-09-14: qty 18, 5d supply, fill #4
  Filled 2022-09-14: qty 102, 25d supply, fill #4
  Filled 2022-12-07: qty 120, 30d supply, fill #5

## 2022-02-16 DIAGNOSIS — H353221 Exudative age-related macular degeneration, left eye, with active choroidal neovascularization: Secondary | ICD-10-CM | POA: Diagnosis not present

## 2022-02-16 DIAGNOSIS — H43813 Vitreous degeneration, bilateral: Secondary | ICD-10-CM | POA: Diagnosis not present

## 2022-02-17 ENCOUNTER — Ambulatory Visit: Payer: Medicare PPO

## 2022-03-03 ENCOUNTER — Other Ambulatory Visit (HOSPITAL_COMMUNITY): Payer: Self-pay

## 2022-03-09 DIAGNOSIS — H43813 Vitreous degeneration, bilateral: Secondary | ICD-10-CM | POA: Diagnosis not present

## 2022-03-09 DIAGNOSIS — H353211 Exudative age-related macular degeneration, right eye, with active choroidal neovascularization: Secondary | ICD-10-CM | POA: Diagnosis not present

## 2022-03-16 ENCOUNTER — Other Ambulatory Visit (HOSPITAL_COMMUNITY): Payer: Self-pay

## 2022-03-20 ENCOUNTER — Other Ambulatory Visit (HOSPITAL_COMMUNITY): Payer: Self-pay

## 2022-03-23 DIAGNOSIS — H353211 Exudative age-related macular degeneration, right eye, with active choroidal neovascularization: Secondary | ICD-10-CM | POA: Diagnosis not present

## 2022-03-23 DIAGNOSIS — H43813 Vitreous degeneration, bilateral: Secondary | ICD-10-CM | POA: Diagnosis not present

## 2022-03-23 DIAGNOSIS — H353122 Nonexudative age-related macular degeneration, left eye, intermediate dry stage: Secondary | ICD-10-CM | POA: Diagnosis not present

## 2022-03-23 DIAGNOSIS — H353221 Exudative age-related macular degeneration, left eye, with active choroidal neovascularization: Secondary | ICD-10-CM | POA: Diagnosis not present

## 2022-03-23 DIAGNOSIS — H353114 Nonexudative age-related macular degeneration, right eye, advanced atrophic with subfoveal involvement: Secondary | ICD-10-CM | POA: Diagnosis not present

## 2022-04-13 ENCOUNTER — Ambulatory Visit: Payer: Medicare PPO | Admitting: Family Medicine

## 2022-04-13 VITALS — BP 110/72 | HR 74 | Ht 60.0 in | Wt 174.0 lb

## 2022-04-13 DIAGNOSIS — R21 Rash and other nonspecific skin eruption: Secondary | ICD-10-CM | POA: Diagnosis not present

## 2022-04-13 HISTORY — DX: Rash and other nonspecific skin eruption: R21

## 2022-04-13 NOTE — Patient Instructions (Addendum)
It was great seeing you today!  Today we discussed your symptoms, this seems to be due to an allergic reaction. It is hard to say to exactly what but it may have been to strawberries or an ingredient in your milkshake.   Please take zyrtec which should help improve the symptoms. Also please apply vaseline to the affected area. Try to make note of things you eat and when you get symptoms because sometimes we can develop allergies to ingredients at any point in life.   If you start to notice throat or mouth swelling, difficulty breathing, pain or worsening redness then please return.   Please follow up at your next scheduled appointment, if anything arises between now and then, please don't hesitate to contact our office.   Thank you for allowing [place] to be a part of your medical care!  Thank you, Dr. Robyne Peers  Also a reminder of our clinic's no-show policy. Please make sure to arrive at least 15 minutes prior to your scheduled appointment time. Please try to cancel before 24 hours if you are not able to make it. If you no-show for 2 appointments then you will be receiving a warning letter. If you no-show after 3 visits, then you may be at risk of being dismissed from our clinic. This is to ensure that everyone is able to be seen in a timely manner. Thank you, we appreciate your assistance with this!

## 2022-04-13 NOTE — Assessment & Plan Note (Signed)
-  erythema and dryness seem most consistent with potential allergic reaction -advised to take short course of zyrtec and apply vaseline to affected area -reassurance provided, discussed making note of any symptoms and triggers to avoid -strict ED and return precautions discussed -follow up as appropriate

## 2022-04-13 NOTE — Progress Notes (Signed)
    SUBJECTIVE:   CHIEF COMPLAINT / HPI:  Patient presents with concern for swollen lips. She first noticed this around midnight when she woke up to use the restroom. Confirms that she did not notice any of these changes when she went to bed. Then she woke up in the morning and they seemed to appear more red around her lips. They have not gotten worse since this time. She has never had anything like this before. Allergies include pollen but denies any known food allergies. Had a strawberry milkshake yesterday and the day before but has never had an allergic reaction. Denies new food intake or contact exposure including creams, moisturizers, toothpaste, makeup, etc. Denies any pain but endorses a sense of discomfort as she feels like her lips are chapped. Denies any new medications. Denies fever, chills, nausea, diarrhea or other rashes noted.   OBJECTIVE:   BP 110/72   Pulse 74   Ht 5' (1.524 m)   Wt 174 lb (78.9 kg)   SpO2 97%   BMI 33.98 kg/m   General: Patient well-appearing, in no acute distress. HEENT: dry lips with erythema noted more prominently surrounding border beyond vermilion border, normal buccal mucosa, no evidence of angioedema or other edema, no evidence of abscess formation CV: RRR, no murmurs or gallops auscultated Resp: CTAB, no wheezing, rales or rhonchi noted Derm: no other rashes or external lesions noted    ASSESSMENT/PLAN:   Rash -erythema and dryness seem most consistent with potential allergic reaction -advised to take short course of zyrtec and apply vaseline to affected area -reassurance provided, discussed making note of any symptoms and triggers to avoid -strict ED and return precautions discussed -follow up as appropriate    -PHQ-9 score of 0 reviewed.   Teresa Leader, DO [facility]

## 2022-04-14 ENCOUNTER — Other Ambulatory Visit (HOSPITAL_COMMUNITY): Payer: Self-pay

## 2022-04-29 DIAGNOSIS — H353122 Nonexudative age-related macular degeneration, left eye, intermediate dry stage: Secondary | ICD-10-CM | POA: Diagnosis not present

## 2022-04-29 DIAGNOSIS — H353211 Exudative age-related macular degeneration, right eye, with active choroidal neovascularization: Secondary | ICD-10-CM | POA: Diagnosis not present

## 2022-04-29 DIAGNOSIS — H353221 Exudative age-related macular degeneration, left eye, with active choroidal neovascularization: Secondary | ICD-10-CM | POA: Diagnosis not present

## 2022-04-29 DIAGNOSIS — H353114 Nonexudative age-related macular degeneration, right eye, advanced atrophic with subfoveal involvement: Secondary | ICD-10-CM | POA: Diagnosis not present

## 2022-04-29 DIAGNOSIS — H43813 Vitreous degeneration, bilateral: Secondary | ICD-10-CM | POA: Diagnosis not present

## 2022-05-04 DIAGNOSIS — S2249XA Multiple fractures of ribs, unspecified side, initial encounter for closed fracture: Secondary | ICD-10-CM | POA: Insufficient documentation

## 2022-05-04 DIAGNOSIS — M1712 Unilateral primary osteoarthritis, left knee: Secondary | ICD-10-CM | POA: Diagnosis not present

## 2022-05-04 DIAGNOSIS — S2241XA Multiple fractures of ribs, right side, initial encounter for closed fracture: Secondary | ICD-10-CM | POA: Diagnosis not present

## 2022-05-04 DIAGNOSIS — S8001XA Contusion of right knee, initial encounter: Secondary | ICD-10-CM | POA: Diagnosis not present

## 2022-05-04 DIAGNOSIS — M19012 Primary osteoarthritis, left shoulder: Secondary | ICD-10-CM | POA: Diagnosis not present

## 2022-05-04 HISTORY — DX: Multiple fractures of ribs, unspecified side, initial encounter for closed fracture: S22.49XA

## 2022-05-12 ENCOUNTER — Other Ambulatory Visit (HOSPITAL_COMMUNITY): Payer: Self-pay

## 2022-05-12 LAB — HM DIABETES EYE EXAM

## 2022-05-18 ENCOUNTER — Encounter: Payer: Self-pay | Admitting: Family Medicine

## 2022-05-18 DIAGNOSIS — S8001XA Contusion of right knee, initial encounter: Secondary | ICD-10-CM

## 2022-05-18 DIAGNOSIS — M19011 Primary osteoarthritis, right shoulder: Secondary | ICD-10-CM

## 2022-05-18 HISTORY — DX: Primary osteoarthritis, right shoulder: M19.011

## 2022-05-18 HISTORY — DX: Contusion of right knee, initial encounter: S80.01XA

## 2022-05-20 ENCOUNTER — Other Ambulatory Visit (HOSPITAL_COMMUNITY): Payer: Self-pay

## 2022-05-22 DIAGNOSIS — S2241XD Multiple fractures of ribs, right side, subsequent encounter for fracture with routine healing: Secondary | ICD-10-CM | POA: Diagnosis not present

## 2022-05-22 DIAGNOSIS — M1712 Unilateral primary osteoarthritis, left knee: Secondary | ICD-10-CM | POA: Diagnosis not present

## 2022-06-05 ENCOUNTER — Other Ambulatory Visit (HOSPITAL_COMMUNITY): Payer: Self-pay

## 2022-06-08 DIAGNOSIS — H353123 Nonexudative age-related macular degeneration, left eye, advanced atrophic without subfoveal involvement: Secondary | ICD-10-CM | POA: Diagnosis not present

## 2022-06-08 DIAGNOSIS — H353114 Nonexudative age-related macular degeneration, right eye, advanced atrophic with subfoveal involvement: Secondary | ICD-10-CM | POA: Diagnosis not present

## 2022-06-08 DIAGNOSIS — H353231 Exudative age-related macular degeneration, bilateral, with active choroidal neovascularization: Secondary | ICD-10-CM | POA: Diagnosis not present

## 2022-06-08 DIAGNOSIS — H43813 Vitreous degeneration, bilateral: Secondary | ICD-10-CM | POA: Diagnosis not present

## 2022-06-26 LAB — HM DIABETES EYE EXAM

## 2022-07-16 ENCOUNTER — Other Ambulatory Visit (HOSPITAL_COMMUNITY): Payer: Self-pay

## 2022-07-16 ENCOUNTER — Other Ambulatory Visit: Payer: Self-pay | Admitting: Family Medicine

## 2022-07-16 MED ORDER — FLUNISOLIDE 25 MCG/ACT (0.025%) NA SOLN
NASAL | 99 refills | Status: DC
  Filled 2022-07-16: qty 25, 25d supply, fill #0

## 2022-07-17 ENCOUNTER — Other Ambulatory Visit (HOSPITAL_COMMUNITY): Payer: Self-pay

## 2022-07-20 ENCOUNTER — Other Ambulatory Visit (HOSPITAL_COMMUNITY): Payer: Self-pay

## 2022-07-27 ENCOUNTER — Other Ambulatory Visit (HOSPITAL_COMMUNITY): Payer: Self-pay

## 2022-07-31 ENCOUNTER — Other Ambulatory Visit (HOSPITAL_COMMUNITY): Payer: Self-pay

## 2022-08-03 DIAGNOSIS — H43813 Vitreous degeneration, bilateral: Secondary | ICD-10-CM | POA: Diagnosis not present

## 2022-08-03 DIAGNOSIS — H353211 Exudative age-related macular degeneration, right eye, with active choroidal neovascularization: Secondary | ICD-10-CM | POA: Diagnosis not present

## 2022-08-03 DIAGNOSIS — H353221 Exudative age-related macular degeneration, left eye, with active choroidal neovascularization: Secondary | ICD-10-CM | POA: Diagnosis not present

## 2022-08-03 DIAGNOSIS — H353114 Nonexudative age-related macular degeneration, right eye, advanced atrophic with subfoveal involvement: Secondary | ICD-10-CM | POA: Diagnosis not present

## 2022-08-03 DIAGNOSIS — H353123 Nonexudative age-related macular degeneration, left eye, advanced atrophic without subfoveal involvement: Secondary | ICD-10-CM | POA: Diagnosis not present

## 2022-09-14 ENCOUNTER — Other Ambulatory Visit: Payer: Self-pay

## 2022-09-14 ENCOUNTER — Other Ambulatory Visit (HOSPITAL_COMMUNITY): Payer: Self-pay

## 2022-09-16 ENCOUNTER — Other Ambulatory Visit (HOSPITAL_COMMUNITY): Payer: Self-pay

## 2022-09-19 IMAGING — MG MM DIGITAL SCREENING BILAT W/ TOMO AND CAD
8 series · 8 of 24 positions shown · non-contrast
Comparison: Previous exam(s).

CLINICAL DATA: Screening.

EXAM:
DIGITAL SCREENING BILATERAL MAMMOGRAM WITH TOMOSYNTHESIS AND CAD
TECHNIQUE: Bilateral screening digital craniocaudal and mediolateral oblique
mammograms were obtained. Bilateral screening digital breast
tomosynthesis was performed. The images were evaluated with
computer-aided detection.

[L CC synth-2D]
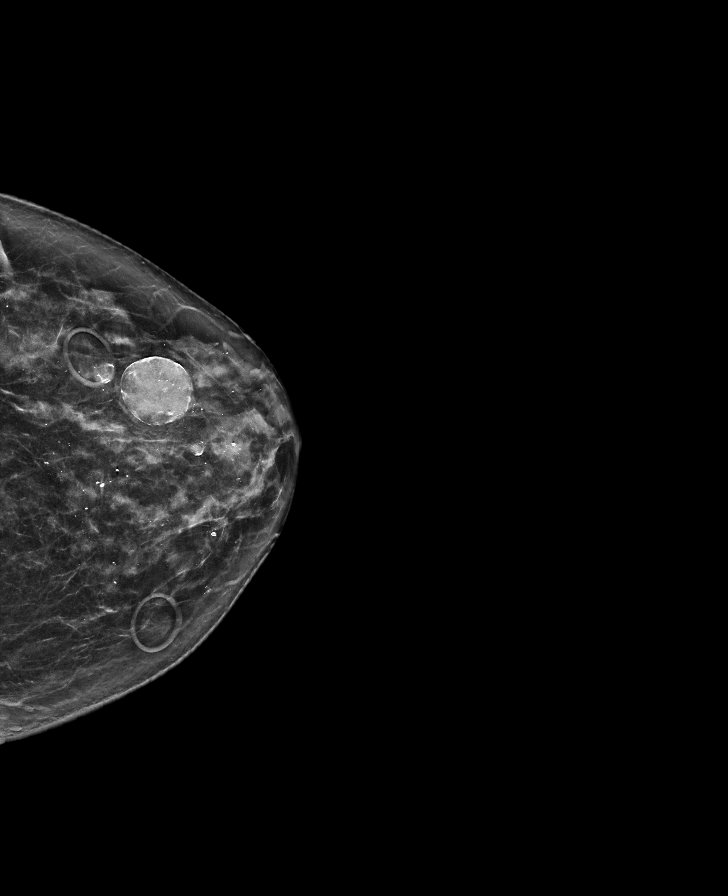

[R CC synth-2D]
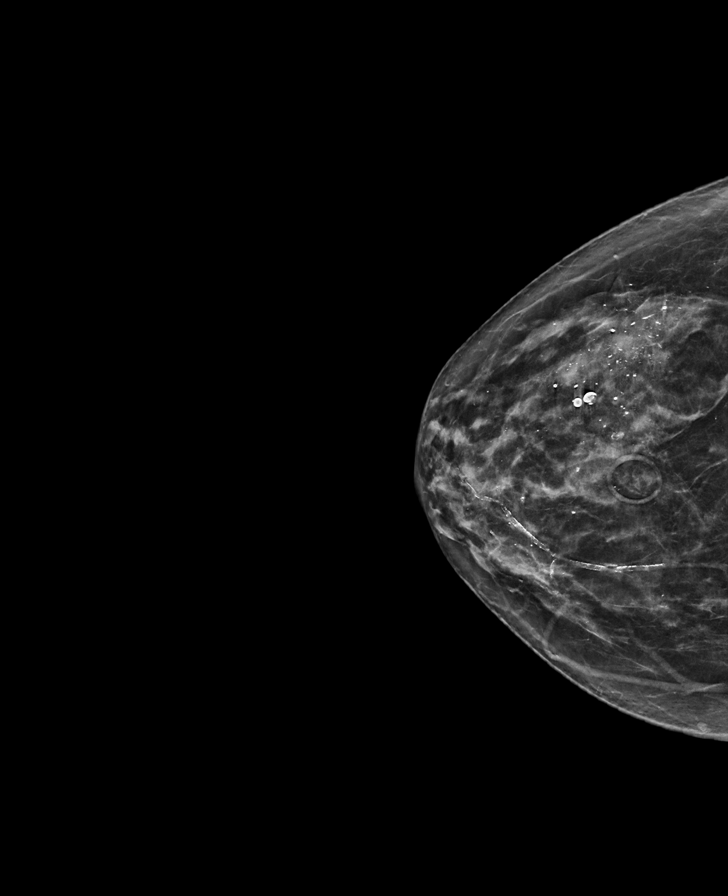

[L MLO synth-2D]
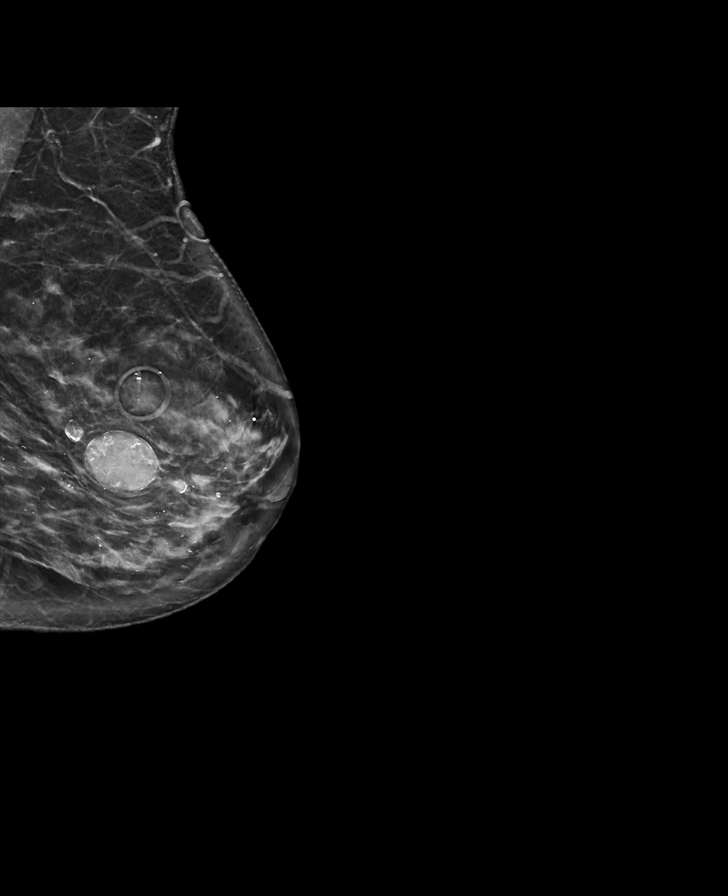

[R MLO synth-2D]
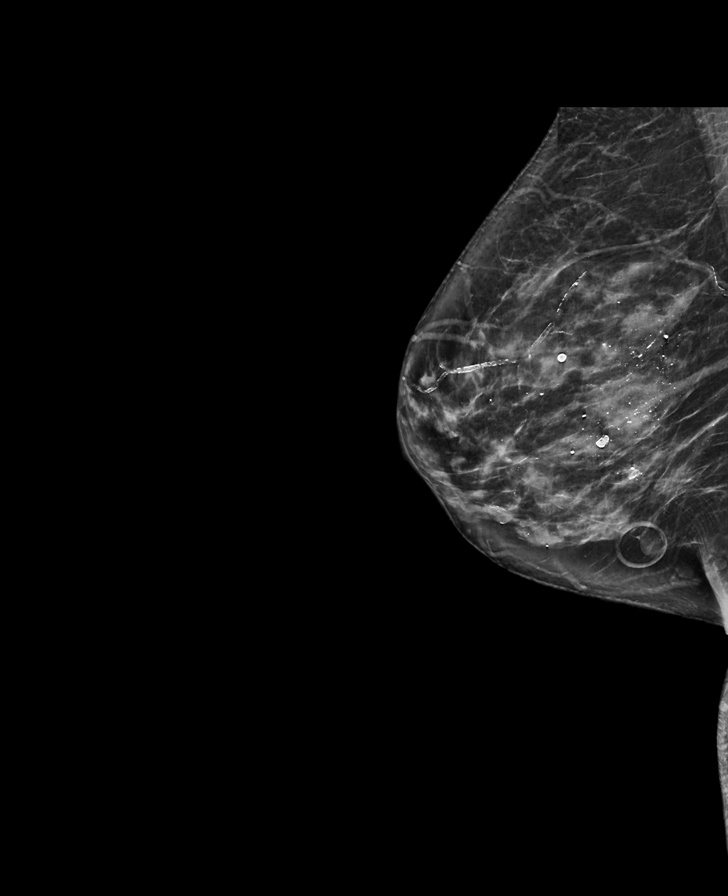

[L MLO tomo · tomo slice 39/76.0]
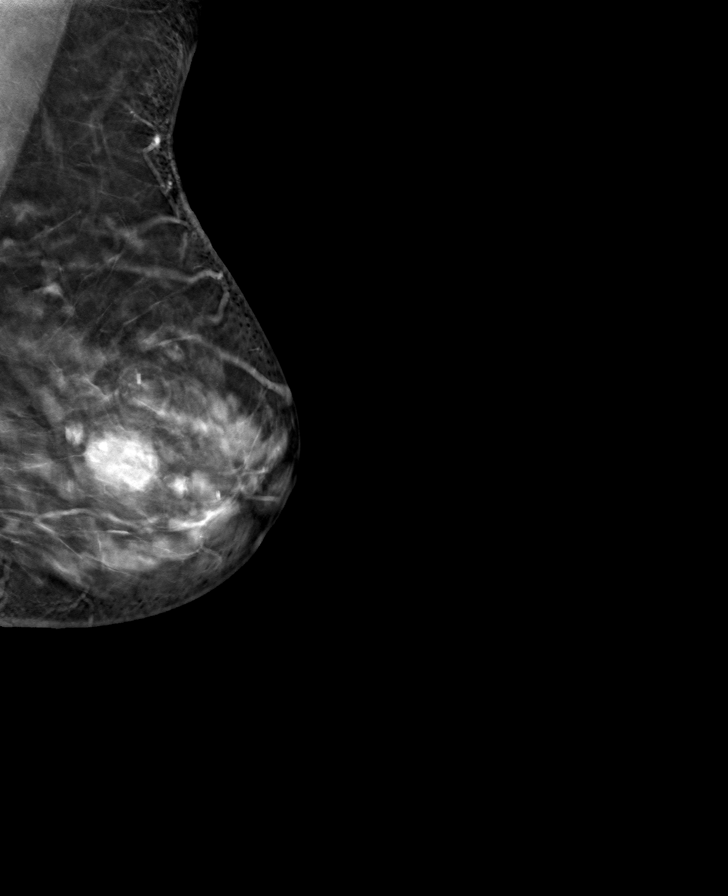

[R CC tomo · tomo slice 33/65.0]
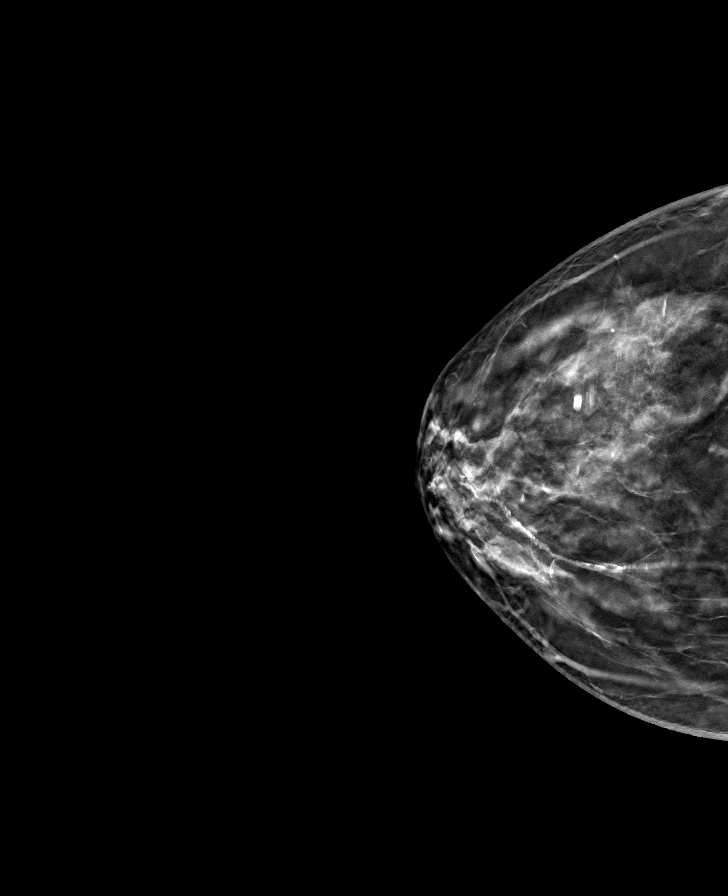

[L CC tomo · tomo slice 35/70.0]
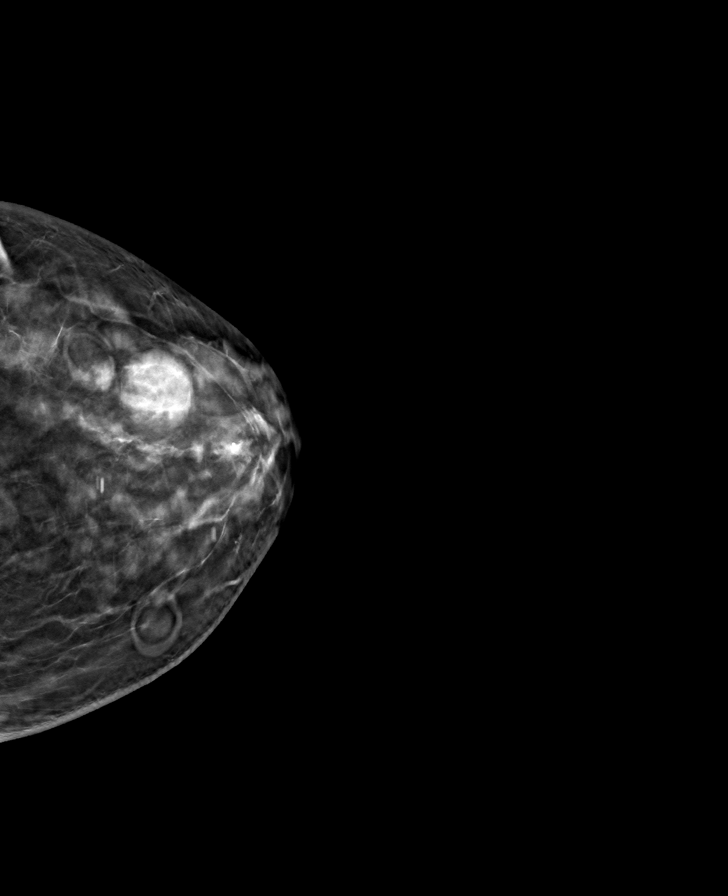

[R MLO tomo · tomo slice 38/75.0]
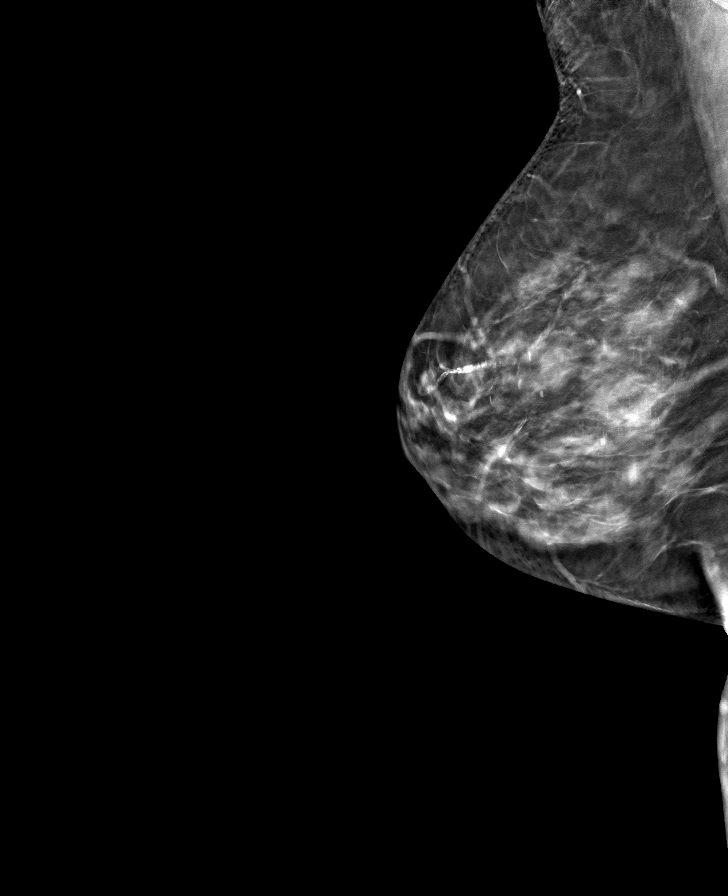

[8 of 24 positions shown; findings below may reference images not displayed]

ACR Breast Density Category c: The breast tissue is heterogeneously
dense, which may obscure small masses.
FINDINGS: There are no findings suspicious for malignancy.
IMPRESSION: No mammographic evidence of malignancy. A result letter of this
screening mammogram will be mailed directly to the patient.

RECOMMENDATION:
Screening mammogram in one year. (Code:[id number])

BI-RADS CATEGORY  1: Negative.

## 2022-09-28 DIAGNOSIS — H43813 Vitreous degeneration, bilateral: Secondary | ICD-10-CM | POA: Diagnosis not present

## 2022-09-28 DIAGNOSIS — H353221 Exudative age-related macular degeneration, left eye, with active choroidal neovascularization: Secondary | ICD-10-CM | POA: Diagnosis not present

## 2022-09-28 DIAGNOSIS — H353123 Nonexudative age-related macular degeneration, left eye, advanced atrophic without subfoveal involvement: Secondary | ICD-10-CM | POA: Diagnosis not present

## 2022-09-28 DIAGNOSIS — H353114 Nonexudative age-related macular degeneration, right eye, advanced atrophic with subfoveal involvement: Secondary | ICD-10-CM | POA: Diagnosis not present

## 2022-09-28 DIAGNOSIS — H353211 Exudative age-related macular degeneration, right eye, with active choroidal neovascularization: Secondary | ICD-10-CM | POA: Diagnosis not present

## 2022-10-21 LAB — HM DIABETES EYE EXAM

## 2022-10-27 IMAGING — DX DG SHOULDER 1V*L*
1 series · 1 of 1 positions shown · non-contrast
Comparison: Preoperative CT 06/29/2020

CLINICAL DATA: Status post reverse total left shoulder
arthroplasty.

EXAM:
LEFT SHOULDER

[shoulder ap]
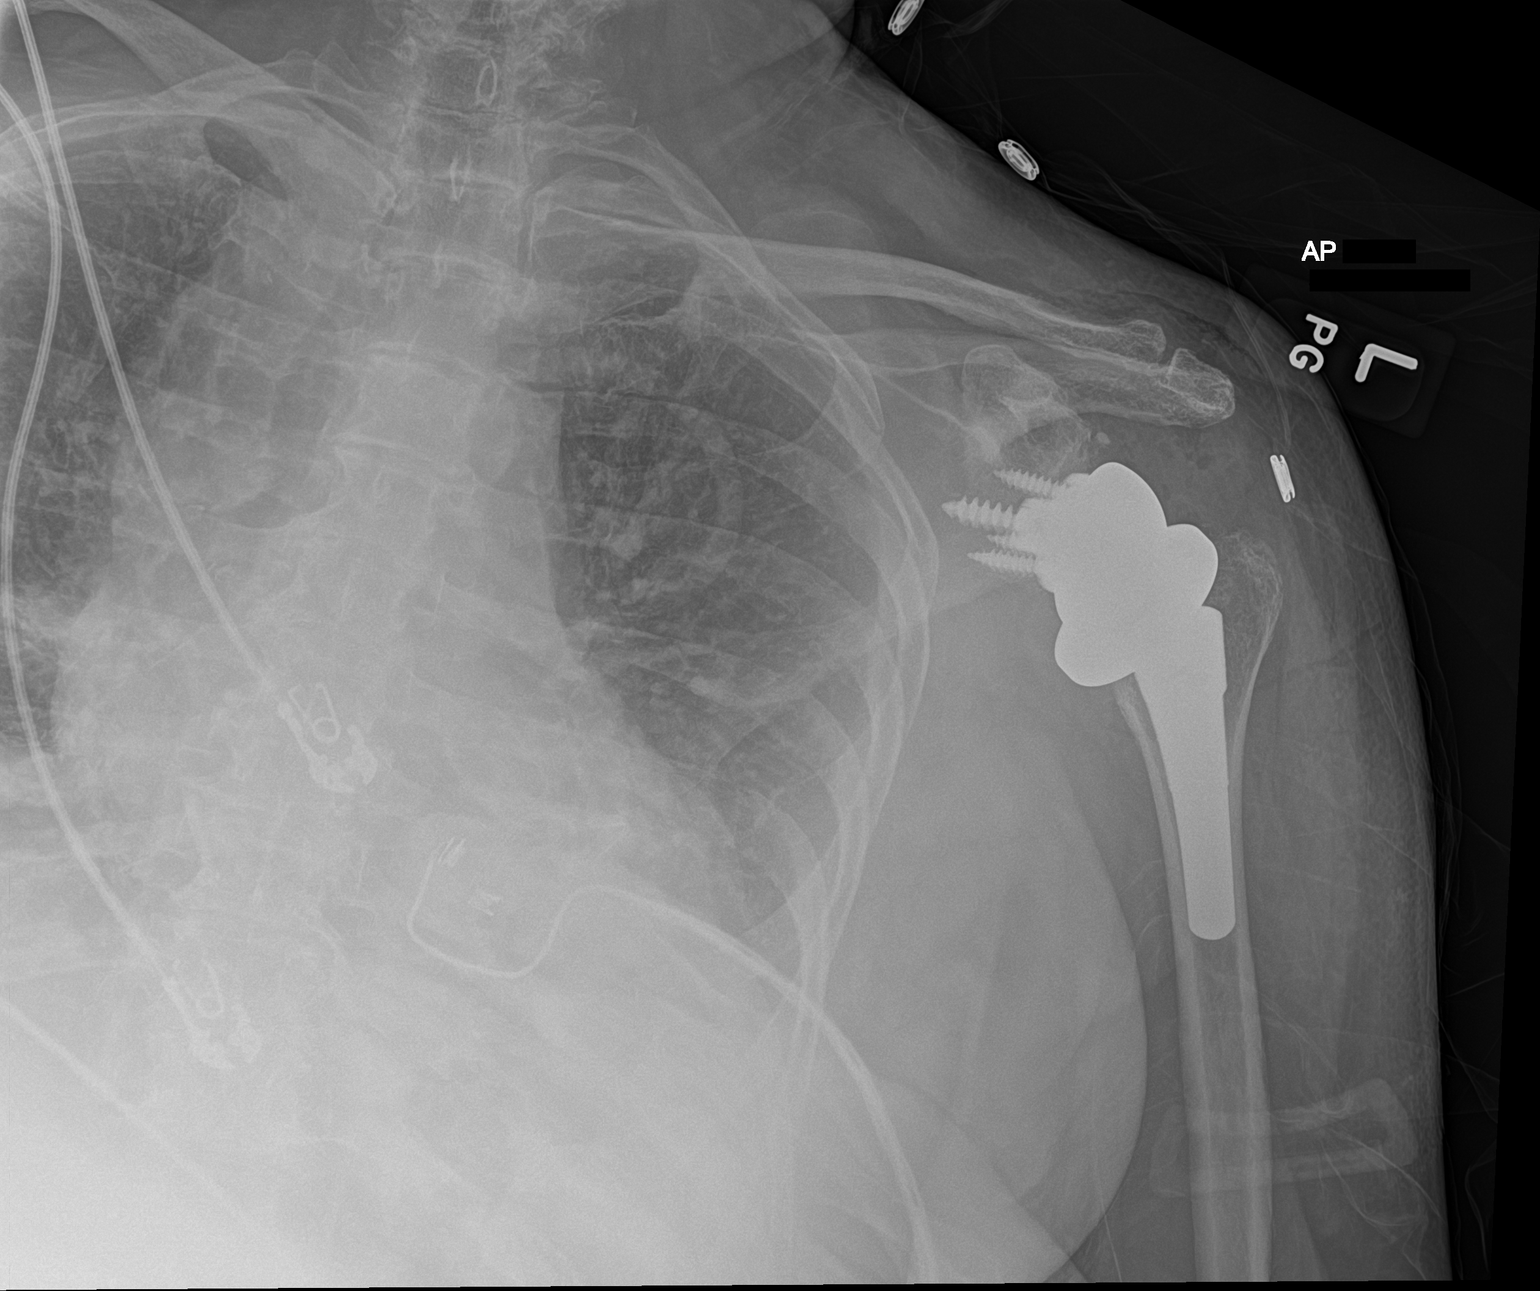

[1 of 1 positions shown; findings below may reference images not displayed]

FINDINGS: Reverse left shoulder arthroplasty in expected alignment. No
periprosthetic fracture. Chronic density adjacent to the superior
glenoid. Recent postsurgical change includes air and edema in the
joint space and soft tissues.
IMPRESSION: Reverse left shoulder arthroplasty without immediate postoperative
complication.

## 2022-11-02 DIAGNOSIS — H353114 Nonexudative age-related macular degeneration, right eye, advanced atrophic with subfoveal involvement: Secondary | ICD-10-CM | POA: Diagnosis not present

## 2022-11-02 DIAGNOSIS — H353123 Nonexudative age-related macular degeneration, left eye, advanced atrophic without subfoveal involvement: Secondary | ICD-10-CM | POA: Diagnosis not present

## 2022-11-02 DIAGNOSIS — H353221 Exudative age-related macular degeneration, left eye, with active choroidal neovascularization: Secondary | ICD-10-CM | POA: Diagnosis not present

## 2022-11-02 DIAGNOSIS — H43813 Vitreous degeneration, bilateral: Secondary | ICD-10-CM | POA: Diagnosis not present

## 2022-11-06 ENCOUNTER — Encounter: Payer: Self-pay | Admitting: Family Medicine

## 2022-11-06 DIAGNOSIS — H35052 Retinal neovascularization, unspecified, left eye: Secondary | ICD-10-CM | POA: Insufficient documentation

## 2022-11-06 DIAGNOSIS — Z860100 Personal history of colon polyps, unspecified: Secondary | ICD-10-CM | POA: Insufficient documentation

## 2022-11-06 DIAGNOSIS — H353130 Nonexudative age-related macular degeneration, bilateral, stage unspecified: Secondary | ICD-10-CM | POA: Insufficient documentation

## 2022-12-07 ENCOUNTER — Other Ambulatory Visit (HOSPITAL_COMMUNITY): Payer: Self-pay

## 2022-12-07 DIAGNOSIS — H353123 Nonexudative age-related macular degeneration, left eye, advanced atrophic without subfoveal involvement: Secondary | ICD-10-CM | POA: Diagnosis not present

## 2022-12-07 DIAGNOSIS — H43813 Vitreous degeneration, bilateral: Secondary | ICD-10-CM | POA: Diagnosis not present

## 2022-12-07 DIAGNOSIS — H353221 Exudative age-related macular degeneration, left eye, with active choroidal neovascularization: Secondary | ICD-10-CM | POA: Diagnosis not present

## 2022-12-07 DIAGNOSIS — H353114 Nonexudative age-related macular degeneration, right eye, advanced atrophic with subfoveal involvement: Secondary | ICD-10-CM | POA: Diagnosis not present

## 2023-01-14 LAB — HM DIABETES EYE EXAM

## 2023-01-21 DIAGNOSIS — H353221 Exudative age-related macular degeneration, left eye, with active choroidal neovascularization: Secondary | ICD-10-CM | POA: Diagnosis not present

## 2023-01-21 DIAGNOSIS — H353123 Nonexudative age-related macular degeneration, left eye, advanced atrophic without subfoveal involvement: Secondary | ICD-10-CM | POA: Diagnosis not present

## 2023-01-21 DIAGNOSIS — H353114 Nonexudative age-related macular degeneration, right eye, advanced atrophic with subfoveal involvement: Secondary | ICD-10-CM | POA: Diagnosis not present

## 2023-01-21 DIAGNOSIS — H43813 Vitreous degeneration, bilateral: Secondary | ICD-10-CM | POA: Diagnosis not present

## 2023-02-08 DIAGNOSIS — H353114 Nonexudative age-related macular degeneration, right eye, advanced atrophic with subfoveal involvement: Secondary | ICD-10-CM | POA: Diagnosis not present

## 2023-02-08 DIAGNOSIS — H353123 Nonexudative age-related macular degeneration, left eye, advanced atrophic without subfoveal involvement: Secondary | ICD-10-CM | POA: Diagnosis not present

## 2023-02-08 DIAGNOSIS — H353211 Exudative age-related macular degeneration, right eye, with active choroidal neovascularization: Secondary | ICD-10-CM | POA: Diagnosis not present

## 2023-02-08 DIAGNOSIS — H43813 Vitreous degeneration, bilateral: Secondary | ICD-10-CM | POA: Diagnosis not present

## 2023-03-17 ENCOUNTER — Ambulatory Visit (INDEPENDENT_AMBULATORY_CARE_PROVIDER_SITE_OTHER): Admitting: Student

## 2023-03-17 VITALS — BP 106/62 | HR 66 | Ht 60.0 in | Wt 176.0 lb

## 2023-03-17 DIAGNOSIS — R7303 Prediabetes: Secondary | ICD-10-CM | POA: Diagnosis not present

## 2023-03-17 DIAGNOSIS — Z23 Encounter for immunization: Secondary | ICD-10-CM | POA: Diagnosis not present

## 2023-03-17 DIAGNOSIS — Z0000 Encounter for general adult medical examination without abnormal findings: Secondary | ICD-10-CM

## 2023-03-17 DIAGNOSIS — R55 Syncope and collapse: Secondary | ICD-10-CM | POA: Diagnosis not present

## 2023-03-17 MED ORDER — TETANUS-DIPHTH-ACELL PERTUSSIS 5-2.5-18.5 LF-MCG/0.5 IM SUSP: 0.5000 mL | Freq: Once | INTRAMUSCULAR | 0 refills | Status: AC

## 2023-03-17 NOTE — Assessment & Plan Note (Addendum)
 Had presyncopal episode with hypotension after receiving COVID-vaccine.   Per patient, has reacted like this when having blood drawn or receiving injections in the past. Was evaluated by EMS, declined going to the ED. Vitals including blood pressure and heart rate significantly improved after drinking water and resting patient was stable and well-appearing prior to leaving the clinic. Likely vasovagal response.  Low concern for CVA given no neurological deficits and improvement back to baseline. Appointment scheduled with PCP Dr. McDiarmid for tomorrow for follow-up and ED precautions given

## 2023-03-17 NOTE — Progress Notes (Signed)
 Subjective:   Teresa Shaffer is a 83 y.o. female who presents for Medicare Annual (Subsequent) preventive examination.  Visit in person at [facility]   Review of Systems:  No concerns/complaints today    Very shortly after receiving COVID-vaccine, patient became very lightheaded and nauseous saying she felt she was about to pass out. BP 71/24, O2 sat 95% and heart rate 41.  On exam she was lethargic but able to answer questions appropriately and was oriented to self and location.  She was able to drink some water.  EMS called to evaluate patient.   Vitals improved with standing BP 106/62 heart rate of 66 and O2 sat in high 90s    Objective:    Exam: General: Lethargy improved, before leaving patient was alert and oriented and well-appearing HEENT: White sclera, clear conjunctiva, PERRL, EOEMI Cardio: RRR, no murmur Lungs: CTAB, normal effort MSK: Good bulk and tone, moving extremities equally Neuro: No facial droop, no dysarthria, no focal deficits, PERRL, EOEMI, no weakness of extremities, A&O x 4  Vitals: BP 106/62 (Patient Position: Standing)   Pulse 66   Ht 5' (1.524 m)   Wt 176 lb (79.8 kg)   SpO2 97%   BMI 34.37 kg/m   Body mass index is 34.37 kg/m.     04/13/2022    9:44 AM 01/15/2022   11:11 AM 08/22/2020    9:06 AM 07/31/2020   10:29 AM 09/07/2019   10:46 AM 12/13/2018    9:11 AM 09/30/2017   10:17 AM  Advanced Directives  Does Patient Have a Medical Advance Directive? Yes No Yes Yes Yes No Yes  Type of Advance Directive Living will   Healthcare Power of [place];Living will Healthcare Power of [place];Living will  Living will;Healthcare Power of Attorney  Does patient want to make changes to medical advance directive?     No - Patient declined    Copy of Healthcare Power of Attorney in Chart?    No - copy requested Yes - validated most recent copy scanned in chart (See row information)  Yes  Would patient like information on creating a medical advance directive?  No -  Patient declined    No - Patient declined     Tobacco Social History   Tobacco Use  Smoking Status Never  Smokeless Tobacco Never     Counseling given: Not Answered   Clinical Intake:     Pain : No/denies pain     BMI - recorded: 34 Nutritional Status: BMI > 30  Obese Nutritional Risks: None Diabetes: No  How often do you need to have someone help you when you read instructions, pamphlets, or other written materials from your doctor or pharmacy?: 1 - Never What is the last grade level you completed in school?: College  Interpreter Needed?: No     Past Medical History:  Diagnosis Date   Adhesive capsulitis of right shoulder 10/25/2014   S/P right rotator cuff repair    Advanced nonexudative age-related macular degeneration of right eye with subfoveal involvement 12/27/2019   Allergic reaction 02/07/2018   Allergic rhinoconjunctivitis 08/05/2017   Cerumen impaction 09/02/2012   Chest pain 04/24/2013   Complete rotator cuff tear 02/16/2013   On scan today she appears to have the anterior and superior capsule torn away with retraction    Degenerative joint disease of knee, left 08/04/2011   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 Minimal DJD changes on xray. Sunrise view wasn't done.     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 Not in range to require treatment    Intermediate stage nonexudative age-related macular degeneration of both eyes 05/03/2019   The nature  of dry age related macular degeneration was discussed with the patient as well as its possible conversion to wet. The results of the AREDS 2 study was discussed with the patient. A diet rich in dark leafy green vegetables was advised and specific recommendations were made regarding supplements with AREDS 2 formulation . Control of hypertension and serum cholesterol may slow the disease.   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 Minimal DJD changes on xray. Sunrise view wasn't done.     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   Pain of cervical spine 11/10/2011   left mid trapezius    Perforated tympanic membrane on examination, left 02/17/2016   Posterior vitreous detachment of both eyes 07/30/2020   POSTHERPETIC NEURALGIA 11/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 Magod MD [facility] GI) - diagnostic colonoscopy for hematochezia   Unsteady gait 09/02/2012   Valgus deformity of great toes, bilateral 08/28/2016   Past Surgical History:  Procedure Laterality Date   BREAST CYST ASPIRATION  1999   benign fibrocystic disease   COLONOSCOPY   09/2014   No polyps, Dr Berton Lan.    COLONOSCOPY W/ BIOPSIES AND POLYPECTOMY  12/2003   tubular adenoma   ENDOMETRIAL BIOPSY     benign   ROTATOR CUFF REPAIR Right    Dr Dion Saucier (Ortho)   TONSILLECTOMY  1953   TOTAL SHOULDER ARTHROPLASTY Left 08/06/2020   Procedure: TOTAL SHOULDER ARTHROPLASTY;  Surgeon: Teryl Lucy, MD;  Location: [facility] ORS;  Service: Orthopedics;  Laterality: Left;   TYMPANOPLASTY WITH GRAFT Left 11/17/2016   Surgeon: Ermalinda Barrios MD (ENT)    Family History  Problem Relation Age of Onset   Neuropathy Father    Diabetes Father    Coronary artery disease Father    Neuropathy Brother    Neuropathy Brother    Diabetes Brother    Social History   Socioeconomic History   Marital  status: Married    Spouse name: Sherrine Maples   Number of children: 1   Years of education: 16   Highest education level: Not on file  Occupational History   Occupation: Retired    Associate Professor: [employer]  Tobacco Use   Smoking status: Never   Smokeless tobacco: Never  Vaping Use   Vaping status: Never Used  Substance and Sexual Activity   Alcohol use: Not Currently    Comment: rare alcohol   Drug use: No   Sexual activity: Not Currently    Partners: Male  Other Topics Concern   Not on file  Social History Narrative   Married to [place], second marriage   Son, [place], by first marriage, born 1966   Retired Engineer, site      Health Care POA:    Emergency Contact: husband, Sherrine Maples, [phone]   End of Life Plan: Mrs Keshauna Degraffenreid has an authorized Transport DNR (Yellow DNR) document dated 12/06/14   Who lives with you: husband   Any pets: none   Diet: Pt has a varied diet of protein, starch, and vegetables   Exercise: Pt has no regular exercise routine.   Seatbelts: Pt reports wearing seatbelt when in vehicles.    Sun Exposure/Protection: Pt reports using sun protection regularly.   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 teaching             Social Drivers of Health   Financial Resource Strain: Not on file  Food Insecurity: Not on file  Transportation Needs: No Transportation Needs (08/19/2021)   PRAPARE - Administrator, Civil Service (Medical): No    Lack of Transportation (Non-Medical): No  Physical Activity: Not on file  Stress: Not on file  Social Connections: Not on file    Outpatient Encounter Medications as of 03/17/2023  Medication Sig   Aflibercept (EYLEA) 2 MG/0.05ML SOLN by Intravitreal route every 3 (three) months.   Calcium Carbonate-Vitamin D 600-400 MG-UNIT tablet Take 1 tablet by mouth daily.   cetirizine (ZYRTEC) 10 MG tablet Take 10 mg by mouth daily.   flunisolide (NASALIDE) 25 MCG/ACT (0.025%) SOLN PLACE 2 SPRAYS INTO EACH NOSTRIL 2 TIMES DAILY AS NEEDED   magnesium oxide (MAG-OX) 400 MG tablet Take 400 mg by mouth daily.   Multiple Vitamin (MULTIVITAMINS PO) Take 1 tablet by mouth daily.   Tdap (BOOSTRIX) 5-2.5-18.5 LF-MCG/0.5 injection Inject 0.5 mLs into the muscle once for 1 dose.   gabapentin (NEURONTIN) 800 MG tablet Take 1 tablet (800 mg total) by mouth 4 (four) times daily.   prednisoLONE acetate (PRED FORTE) 1 % ophthalmic suspension Place 1 drop to right eye 4 times a day (Patient not taking: Reported on 03/17/2023)   [DISCONTINUED] amoxicillin (AMOXIL) 500 MG capsule Take 1 capsule (500 mg total) by mouth every 8 (eight) hours until finished   [DISCONTINUED] benzonatate (TESSALON PERLES) 100 MG capsule Take 1 capsule (100 mg total) by mouth 3 (three) times daily as needed for cough.   [DISCONTINUED] hydrocortisone cream 1 % Apply 1 application topically daily. Apply to face   [DISCONTINUED] moxifloxacin (VIGAMOX) 0.5 % ophthalmic solution Place 1 drop into the right eye 4 (four) times daily, starting 2 days prior to surgery and 2 drops the morning of surgery   [DISCONTINUED] Multiple Vitamins-Minerals (PRESERVISION AREDS) TABS Take 1 capsule by mouth in the morning and at bedtime.    No facility-administered encounter medications on file as of 03/17/2023.    Activities of Daily Living  No data to display          Patient Care Team: McDiarmid, Leighton Roach, MD as PCP - General (Family Medicine) Ermalinda Barrios, MD as Consulting Physician (Otolaryngology) Teryl Lucy, MD as Consulting Physician (Orthopedic Surgery)    Assessment:   This is a routine wellness examination for Cherise.  Exercise Activities and Dietary recommendations     Goals   None     Fall Risk    04/13/2022    9:44 AM 01/15/2022   11:10 AM 08/22/2020    8:54 AM 09/07/2019   10:45 AM 12/13/2018    9:11 AM  Fall Risk   Falls in the past year? 0 0 0 0 0  Number falls in past yr: 0 0 0 0   Injury with Fall? 0 0 0 0   Follow up Falls evaluation completed   Falls evaluation completed    Is the patient's home free of loose throw rugs in walkways, pet beds, electrical cords, etc?    Has one rug, does not think this is tripping hazard      Grab bars in the bathroom? no, does not feel that she needs them      Handrails on the stairs?   yes      Adequate lighting?   yes  Patient rating of health (0-10) scale: 9/10   Depression Screen    04/13/2022    9:43 AM 01/15/2022   11:10 AM 08/22/2020    8:53 AM 09/07/2019   10:45 AM  PHQ 2/9 Scores  PHQ - 2 Score 0 0 0 0  PHQ- 9 Score 0 2 0 2     Cognitive Function    02/24/2013   11:00 AM 08/06/2011    3:00 PM  MMSE - Mini Mental State Exam  Orientation to time 5 5  Orientation to Place 5 5  Registration 3 3  Attention/ Calculation 5 5  Recall 3 2  Language- name 2 objects 2 2  Language- repeat 1 1  Language- follow 3 step command 3 3  Language- read & follow direction 1 1  Write a sentence 1 1  Copy design 1 1  Total score 30 29        03/17/2023   10:22 AM  6CIT Screen  What Year? 0 points  What month? 0 points  What time? 0 points  Count back from 20 0 points  Months in reverse 2 points  Repeat phrase 10 points  Total Score 12  points    Immunization History  Administered Date(s) Administered   Fluad Quad(high Dose 65+) 09/07/2019   Influenza Split 11/10/2011, 11/01/2021, 09/11/2022   Influenza Whole 12/15/2006, 11/10/2007, 12/18/2008   Influenza, High Dose Seasonal PF 11/08/2018   Influenza,inj,Quad PF,6+ Mos 01/10/2013, 10/25/2014, 08/27/2016, 09/30/2017   Influenza-Unspecified 12/24/2015, 11/08/2018, 11/11/2020   Moderna SARS-COV2 Booster Vaccination 07/15/2020, 11/01/2021   PFIZER Comirnaty(Gray Top)Covid-19 Tri-Sucrose Vaccine 11/11/2020   PFIZER(Purple Top)SARS-COV-2 Vaccination 02/19/2019, 03/14/2019, 09/11/2022   Pfizer Covid-19 Vaccine Bivalent Booster 46yrs & up 04/17/2021   Pneumococcal Conjugate-13 10/25/2014   Pneumococcal Polysaccharide-23 11/06/2003, 08/06/2011   Td 08/05/2000   Tdap 03/03/2012   Zoster Recombinant(Shingrix) 02/27/2020, 11/11/2020     Screening Tests Health Maintenance  Topic Date Due   DTaP/Tdap/Td (3 - Td or Tdap) 03/03/2022   COVID-19 Vaccine (8 - 2024-25 season) 11/06/2022   Medicare Annual Wellness (AWV)  03/16/2024   Pneumonia Vaccine 2+ Years old  Completed   INFLUENZA VACCINE  Completed  DEXA SCAN  Completed   Zoster Vaccines- Shingrix  Completed   HPV VACCINES  Aged Out   Hepatitis C Screening  Discontinued    Cancer Screenings: Lung: Low Dose CT Chest recommended if Age 38-80 years, 20 pack-year currently smoking OR have quit w/in 15years. Patient does not qualify. Breast:  Up to date on Mammogram? Yes  - aged out Up to date of Bone Density/Dexa? Yes Colorectal: Aged out  Additional Screenings: non indicated: Hepatitis C Screening:      Plan:    Pt received COVID vaccine today   Assessment & Plan Pre-syncope Had presyncopal episode with hypotension after receiving COVID-vaccine.   Per patient, has reacted like this when having blood drawn or receiving injections in the past. Was evaluated by EMS, declined going to the ED. Vitals including  blood pressure and heart rate significantly improved after drinking water and resting patient was stable and well-appearing prior to leaving the clinic. Likely vasovagal response.  Low concern for CVA given no neurological deficits and improvement back to baseline. Appointment scheduled with PCP Dr. McDiarmid for tomorrow for follow-up and ED precautions given   I have personally reviewed and noted the following in the patient's chart:   Medical and social history Use of alcohol, tobacco or illicit drugs  Current medications and supplements Functional ability and status Nutritional status Physical activity Advanced directives List of other physicians Hospitalizations, surgeries, and ER visits in previous 12 months Vitals Screenings to include cognitive, depression, and falls Referrals and appointments  In addition, I have reviewed and discussed with patient certain preventive protocols, quality metrics, and best practice recommendations. A written personalized care plan for preventive services as well as general preventive health recommendations were provided to patient.      Erick Alley, DO  03/17/2023

## 2023-03-17 NOTE — Patient Instructions (Addendum)
 It was great to see you! Thank you for allowing me to participate in your care!    Our plans for today:  -Goals: Either go walk at the gym or use bike at home at least 2x/week -You received COVID vaccines today. Your arm may be sore for a few days and you may have sick symptoms tomorrow  -Schedule apt with PCP for tomorrow    Take care and seek immediate care sooner if you develop any concerns.   Dr. Erick Alley, DO [facility]

## 2023-03-22 DIAGNOSIS — H43813 Vitreous degeneration, bilateral: Secondary | ICD-10-CM | POA: Diagnosis not present

## 2023-03-22 DIAGNOSIS — H353221 Exudative age-related macular degeneration, left eye, with active choroidal neovascularization: Secondary | ICD-10-CM | POA: Diagnosis not present

## 2023-03-22 DIAGNOSIS — H353114 Nonexudative age-related macular degeneration, right eye, advanced atrophic with subfoveal involvement: Secondary | ICD-10-CM | POA: Diagnosis not present

## 2023-03-22 DIAGNOSIS — H353123 Nonexudative age-related macular degeneration, left eye, advanced atrophic without subfoveal involvement: Secondary | ICD-10-CM | POA: Diagnosis not present

## 2023-04-07 ENCOUNTER — Other Ambulatory Visit: Payer: Self-pay | Admitting: Family Medicine

## 2023-04-07 ENCOUNTER — Other Ambulatory Visit (HOSPITAL_COMMUNITY): Payer: Self-pay

## 2023-04-08 ENCOUNTER — Other Ambulatory Visit (HOSPITAL_COMMUNITY): Payer: Self-pay

## 2023-04-08 MED ORDER — GABAPENTIN 800 MG PO TABS
800.0000 mg | ORAL_TABLET | Freq: Four times a day (QID) | ORAL | 11 refills | Status: AC
  Filled 2023-04-08: qty 120, 30d supply, fill #0
  Filled 2023-05-03: qty 120, 30d supply, fill #1

## 2023-05-03 ENCOUNTER — Other Ambulatory Visit (HOSPITAL_COMMUNITY): Payer: Self-pay

## 2023-06-01 DIAGNOSIS — H353114 Nonexudative age-related macular degeneration, right eye, advanced atrophic with subfoveal involvement: Secondary | ICD-10-CM | POA: Diagnosis not present

## 2023-06-01 DIAGNOSIS — H353221 Exudative age-related macular degeneration, left eye, with active choroidal neovascularization: Secondary | ICD-10-CM | POA: Diagnosis not present

## 2023-06-01 DIAGNOSIS — H43813 Vitreous degeneration, bilateral: Secondary | ICD-10-CM | POA: Diagnosis not present

## 2023-06-01 DIAGNOSIS — H353123 Nonexudative age-related macular degeneration, left eye, advanced atrophic without subfoveal involvement: Secondary | ICD-10-CM | POA: Diagnosis not present

## 2023-06-02 ENCOUNTER — Encounter: Payer: Self-pay | Admitting: Family Medicine

## 2023-06-02 DIAGNOSIS — F40298 Other specified phobia: Secondary | ICD-10-CM | POA: Insufficient documentation

## 2023-06-26 ENCOUNTER — Encounter (HOSPITAL_COMMUNITY): Payer: Self-pay

## 2023-06-26 ENCOUNTER — Ambulatory Visit (HOSPITAL_COMMUNITY)
Admission: RE | Admit: 2023-06-26 | Discharge: 2023-06-26 | Disposition: A | Discharge status: Home / Self Care | Source: Ambulatory Visit | Attending: Emergency Medicine | Admitting: Emergency Medicine

## 2023-06-26 ENCOUNTER — Ambulatory Visit (INDEPENDENT_AMBULATORY_CARE_PROVIDER_SITE_OTHER)

## 2023-06-26 VITALS — BP 127/72 | HR 79 | Temp 98.4°F | Resp 16

## 2023-06-26 DIAGNOSIS — R059 Cough, unspecified: Secondary | ICD-10-CM | POA: Diagnosis not present

## 2023-06-26 DIAGNOSIS — R0602 Shortness of breath: Secondary | ICD-10-CM | POA: Diagnosis not present

## 2023-06-26 DIAGNOSIS — R0989 Other specified symptoms and signs involving the circulatory and respiratory systems: Secondary | ICD-10-CM | POA: Diagnosis not present

## 2023-06-26 DIAGNOSIS — R051 Acute cough: Secondary | ICD-10-CM

## 2023-06-26 DIAGNOSIS — J209 Acute bronchitis, unspecified: Secondary | ICD-10-CM

## 2023-06-26 DIAGNOSIS — Z96612 Presence of left artificial shoulder joint: Secondary | ICD-10-CM | POA: Diagnosis not present

## 2023-06-26 MED ORDER — ALBUTEROL SULFATE (2.5 MG/3ML) 0.083% IN NEBU
INHALATION_SOLUTION | RESPIRATORY_TRACT | Status: AC
  Filled 2023-06-26: qty 3

## 2023-06-26 MED ORDER — SPACER/AERO-HOLDING CHAMBERS DEVI: 1.0000 | 0 refills | Status: DC | PRN

## 2023-06-26 MED ORDER — BENZONATATE 100 MG PO CAPS: 100.0000 mg | ORAL_CAPSULE | Freq: Three times a day (TID) | ORAL | 0 refills | Status: AC

## 2023-06-26 MED ORDER — AZITHROMYCIN 250 MG PO TABS: 250.0000 mg | ORAL_TABLET | Freq: Every day | ORAL | 0 refills | Status: DC

## 2023-06-26 MED ORDER — ALBUTEROL SULFATE HFA 108 (90 BASE) MCG/ACT IN AERS: 1.0000 | INHALATION_SPRAY | Freq: Four times a day (QID) | RESPIRATORY_TRACT | 0 refills | Status: AC | PRN

## 2023-06-26 MED ORDER — ALBUTEROL SULFATE (2.5 MG/3ML) 0.083% IN NEBU
2.5000 mg | INHALATION_SOLUTION | Freq: Once | RESPIRATORY_TRACT | Status: AC
  Administered 2023-06-26: 2.5 mg via RESPIRATORY_TRACT

## 2023-06-26 NOTE — ED Triage Notes (Signed)
 Patient here today with c/o cough X 3 days. Cough is worse at night while lying down. She has been using an OTC cough syrup with some relief.

## 2023-06-26 NOTE — ED Provider Notes (Signed)
 [facility]    CSN: [id number] Arrival date & time: 06/26/23  1428      History   Chief Complaint Chief Complaint  Patient presents with   Cough    Entered by patient    HPI Teresa Shaffer is a 83 y.o. female. She has been sick with coughing, congestion x3 days. Has coughing fits. Sx worse when lying down, then coughing is much worse. No hx asthma or wheezing. Is a little SOB. Husband is not sick with similar. Has been taking some kind of OTC tussin cough medicine for little relief. Denies sore throat, headache. Does have some post nasal drainage. Cough is not productive   Cough   Past Medical History:  Diagnosis Date   Adhesive capsulitis of right shoulder 10/25/2014   S/P right rotator cuff repair    Advanced nonexudative age-related macular degeneration of right eye with subfoveal involvement 12/27/2019   Allergic reaction 02/07/2018   Allergic rhinoconjunctivitis 08/05/2017   Cerumen impaction 09/02/2012   Chest pain 04/24/2013   Complete rotator cuff tear 02/16/2013   On scan today she appears to have the anterior and superior capsule torn away with retraction    Degenerative joint disease of knee, left 08/04/2011   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 Minimal DJD changes on xray. Sunrise view wasn't done.     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 Not in range to require treatment    Intermediate stage nonexudative age-related macular  degeneration of both eyes 05/03/2019   The nature of dry age related macular degeneration was discussed with the patient as well as its possible conversion to wet. The results of the AREDS 2 study was discussed with the patient. A diet rich in dark leafy green vegetables was advised and specific recommendations were made regarding supplements with AREDS 2 formulation . Control of hypertension and serum cholesterol may slow the disease.   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 Minimal DJD changes on xray. Sunrise view wasn't done.     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   Pain of cervical spine 11/10/2011   left mid trapezius    Perforated tympanic membrane on examination, left 02/17/2016   Posterior vitreous detachment of both eyes 07/30/2020   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   Age-related macular degeneration, dry, both eyes 11/06/2022   History of colonic polyps 11/06/2022   Osteoarthritis of right shoulder 05/18/2022   Intermediate stage nonexudative age-related macular degeneration of left eye 12/27/2019   Exudative age-related macular degeneration of right eye with active choroidal neovascularization (HCC) 05/03/2019   At risk for diabetes mellitus 08/06/2017   Mixed conductive and sensorineural hearing loss, 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    COLONOSCOPY W/ BIOPSIES AND POLYPECTOMY  12/2003   tubular adenoma   ENDOMETRIAL BIOPSY     benign   ROTATOR CUFF REPAIR Right    Dr Josefina (Ortho)   TONSILLECTOMY  1953   TOTAL SHOULDER ARTHROPLASTY Left 08/06/2020   Procedure: TOTAL SHOULDER ARTHROPLASTY;  Surgeon: Josefina Chew, MD;  Location: [facility];  Service: Orthopedics;  Laterality: Left;   TYMPANOPLASTY WITH GRAFT Left 11/17/2016   Surgeon: Camellia Milliner MD (ENT)     OB History   No obstetric history on file.      Home Medications    Prior to Admission medications   Medication Sig Start Date End Date Taking? Authorizing Provider  albuterol  (VENTOLIN  HFA) 108 (90 Base) MCG/ACT inhaler Inhale 1-2 puffs into the lungs every 6 (six) hours as needed for wheezing or shortness of breath. 06/26/23  Yes Taniyah Ballow, Jon HERO, NP  azithromycin (ZITHROMAX) 250 MG tablet Take 1 tablet (250 mg total) by mouth daily. Take first 2 tablets together, then 1 every day until finished. 06/26/23  Yes Richad Jon HERO, NP  benzonatate  (TESSALON ) 100 MG  capsule Take 1 capsule (100 mg total) by mouth every 8 (eight) hours. 06/26/23  Yes Richad Jon HERO, NP  Spacer/Aero-Holding Raguel DEVI 1 each by Does not apply route as needed. 06/26/23  Yes Richad Jon HERO, NP  Aflibercept  (EYLEA ) 2 MG/0.05ML SOLN by Intravitreal route every 3 (three) months. 09/07/19   McDiarmid, Krystal BIRCH, MD  Calcium Carbonate-Vitamin D  600-400 MG-UNIT tablet Take 1 tablet by mouth daily.    [provider]  cetirizine  (ZYRTEC ) 10 MG tablet Take 10 mg by mouth daily. 08/06/17   McDiarmid, Krystal BIRCH, MD  flunisolide  (NASALIDE ) 25 MCG/ACT (0.025%) SOLN PLACE 2 SPRAYS INTO EACH NOSTRIL 2 TIMES DAILY AS NEEDED 07/16/22 07/16/23  McDiarmid, Krystal BIRCH, MD  gabapentin  (NEURONTIN ) 800 MG tablet Take 1 tablet (800 mg total) by mouth 4 (four) times daily. 04/08/23 04/07/24  McDiarmid, Krystal BIRCH, MD  magnesium oxide (MAG-OX) 400 MG tablet Take 400 mg by mouth daily.    [provider]  Multiple Vitamin (MULTIVITAMINS PO) Take 1 tablet by mouth daily.    [provider]  prednisoLONE  acetate (PRED FORTE ) 1 % ophthalmic suspension Place 1 drop to right eye 4 times a day Patient not taking: Reported on 03/17/2023 05/30/21       Family History Family History  Problem Relation Age of Onset   Neuropathy Father    Diabetes Father    Coronary artery disease Father    Neuropathy Brother    Neuropathy Brother    Diabetes Brother     Social History Social History   Tobacco Use   Smoking status: Never   Smokeless tobacco: Never  Vaping Use   Vaping status: Never Used  Substance Use Topics   Alcohol use: Not Currently    Comment: rare alcohol   Drug use: No     Allergies   Patient has no known allergies.   Review of Systems Review of Systems  Respiratory:  Positive for cough.      Physical Exam Triage Vital Signs ED Triage Vitals  Encounter Vitals Group     BP 06/26/23 1514 127/72     Girls Systolic BP Percentile --      Girls Diastolic BP Percentile --       Boys Systolic BP Percentile --      Boys Diastolic BP Percentile --      Pulse Rate 06/26/23 1514 79     Resp 06/26/23 1514 16     Temp 06/26/23 1514 98.4 F (36.9 C)     Temp Source 06/26/23 1514 Oral     SpO2 06/26/23 1514 93 %     Weight --      Height --      Head Circumference --      Peak Flow --      Pain Score 06/26/23 1515 0     Pain Loc --      Pain Education --      Exclude from Growth Chart --    No data found.  Updated Vital Signs BP 127/72 (BP Location: Left Arm)   Pulse 79   Temp 98.4 F (36.9 C) (Oral)   Resp 16   SpO2 93%   Visual Acuity Right Eye Distance:   Left Eye Distance:   Bilateral Distance:    Right Eye Near:   Left Eye Near:    Bilateral Near:     Physical Exam Constitutional:      Appearance: Normal appearance.  HENT:     Right Ear: Tympanic membrane, ear canal and external ear normal.     Left Ear: Tympanic membrane, ear canal and external ear normal.     Mouth/Throat:     Mouth: Mucous membranes are moist.     Pharynx: Oropharynx is clear. No oropharyngeal exudate or posterior oropharyngeal erythema.   Cardiovascular:     Rate and Rhythm: Normal rate and regular rhythm.  Pulmonary:     Effort: Pulmonary effort is normal.     Breath sounds: Examination of the right-upper field reveals rhonchi. Examination of the left-upper field reveals rhonchi. Examination of the right-middle field reveals rhonchi. Examination of the left-middle field reveals rhonchi. Examination of the right-lower field reveals rhonchi. Examination of the left-lower field reveals rhonchi. Rhonchi present.     Comments: Frequent wet sounding cough  Neurological:     Mental Status: She is alert.      UC Treatments / Results  Labs (all labs ordered are listed, but only abnormal results are displayed) Labs Reviewed - No data to display  EKG   Radiology DG Chest  2 View Result Date: 06/26/2023 CLINICAL DATA:  Three day history of cough, shortness of breath,  and rhonchi EXAM: CHEST - 2 VIEW COMPARISON:  Chest radiograph dated 10/26/2016 FINDINGS: Normal lung volumes. No focal consolidations. No pleural effusion or pneumothorax. The heart size and mediastinal contours are within normal limits. Left shoulder arthroplasty. Old right rib fracture. IMPRESSION: No active cardiopulmonary disease. Electronically Signed   By: Limin  Xu M.D.   On: 06/26/2023 16:16    Procedures Procedures (including critical care time)  Medications Ordered in UC Medications  albuterol  (PROVENTIL ) (2.5 MG/3ML) 0.083% nebulizer solution 2.5 mg (2.5 mg Nebulization Given 06/26/23 1605)    Initial Impression / Assessment and Plan / UC Course  I have reviewed the triage vital signs and the nursing notes.  Pertinent labs & imaging results that were available during my care of the patient were reviewed by me and considered in my medical decision making (see chart for details).     CXR neg for pneumonia.   Albuterol  neb did help- initially caused more coughing for clear sputum production, then cough frequency reduced. Rhonchi decreased. No wheezing. Will treat for bronchitis.    Final Clinical Impressions(s) / UC Diagnoses   Final diagnoses:  Acute cough  Acute bronchitis, unspecified organism     Discharge Instructions      If you are not getting better, or if you have worsening shortness of breath, get rechecked in person.      ED Prescriptions     Medication Sig Dispense Auth. Provider   albuterol  (VENTOLIN  HFA) 108 (90 Base) MCG/ACT inhaler Inhale 1-2 puffs into the lungs every 6 (six) hours as needed for wheezing or shortness of breath. 51 each Richad Jon HERO, NP   Spacer/Aero-Holding Raguel FRENCH 1 each by Does not apply route as needed. 1 each Richad Jon HERO, NP   azithromycin (ZITHROMAX) 250 MG tablet Take 1 tablet (250 mg total) by mouth daily. Take first 2 tablets together, then 1 every day until finished. 6 tablet Richad Jon HERO, NP   benzonatate   (TESSALON ) 100 MG capsule Take 1 capsule (100 mg total) by mouth every 8 (eight) hours. 21 capsule Richad Jon HERO, NP      PDMP not reviewed this encounter.   Richad Jon HERO, NP 06/27/23 1024

## 2023-06-26 NOTE — Discharge Instructions (Signed)
 If you are not getting better, or if you have worsening shortness of breath, get rechecked in person.

## 2023-07-27 DIAGNOSIS — H43813 Vitreous degeneration, bilateral: Secondary | ICD-10-CM | POA: Diagnosis not present

## 2023-07-27 DIAGNOSIS — H353221 Exudative age-related macular degeneration, left eye, with active choroidal neovascularization: Secondary | ICD-10-CM | POA: Diagnosis not present

## 2023-07-27 DIAGNOSIS — H353114 Nonexudative age-related macular degeneration, right eye, advanced atrophic with subfoveal involvement: Secondary | ICD-10-CM | POA: Diagnosis not present

## 2023-07-27 DIAGNOSIS — H353123 Nonexudative age-related macular degeneration, left eye, advanced atrophic without subfoveal involvement: Secondary | ICD-10-CM | POA: Diagnosis not present

## 2023-07-28 ENCOUNTER — Encounter: Payer: Self-pay | Admitting: Family Medicine

## 2023-08-12 ENCOUNTER — Ambulatory Visit: Admitting: Family Medicine

## 2023-08-12 ENCOUNTER — Encounter: Payer: Self-pay | Admitting: Family Medicine

## 2023-08-12 VITALS — BP 99/67 | HR 79 | Ht 60.0 in | Wt 174.2 lb

## 2023-08-12 DIAGNOSIS — G609 Hereditary and idiopathic neuropathy, unspecified: Secondary | ICD-10-CM

## 2023-08-12 DIAGNOSIS — Z9189 Other specified personal risk factors, not elsewhere classified: Secondary | ICD-10-CM

## 2023-08-12 NOTE — Patient Instructions (Signed)
 Please call our office in September to see if we have the Influenza vaccine.

## 2023-08-13 ENCOUNTER — Encounter: Payer: Self-pay | Admitting: Family Medicine

## 2023-08-13 NOTE — Assessment & Plan Note (Signed)
Established problem. Stable. No signs of complications, medication side effects, or red flags. Continue gabapentin

## 2023-08-13 NOTE — Progress Notes (Signed)
 Teresa Shaffer is accompanied by daughter and husband Sources of clinical information for visit is/are patient. Nursing assessment for this office visit was reviewed with the patient for accuracy and revision.   Previous Report(s) Reviewed: none     08/12/2023    3:35 PM  Depression screen PHQ 2/9  Decreased Interest 0  Down, Depressed, Hopeless 0  PHQ - 2 Score 0  Altered sleeping 0  Tired, decreased energy 1  Change in appetite 1  Feeling bad or failure about yourself  0  Trouble concentrating 0  Moving slowly or fidgety/restless 0  Suicidal thoughts 0  PHQ-9 Score 2  Difficult doing work/chores Not difficult at all   [employer] Office Visit from 08/12/2023 in [facility] - A Dept Of [place]. [facility] Office Visit from 04/13/2022 in [facility] - A Dept Of [place]. [facility] Office Visit from 01/15/2022 in [facility] - A Dept Of [place]. [facility]  Thoughts that you would be better off dead, or of hurting yourself in some way Not at all Not at all Not at all  PHQ-9 Total Score 2 0 2       08/12/2023    3:35 PM 04/13/2022    9:44 AM 01/15/2022   11:10 AM 08/22/2020    8:54 AM 09/07/2019   10:45 AM  Fall Risk   Falls in the past year? 0 0 0 0 0  Number falls in past yr: 0 0 0 0 0  Injury with Fall? 0 0 0 0 0  Follow up  Falls evaluation completed   Falls evaluation completed      Data saved with a previous flowsheet row definition       08/12/2023    3:35 PM 04/13/2022    9:43 AM 01/15/2022   11:10 AM  PHQ9 SCORE ONLY  PHQ-9 Total Score 2 0 2    There are no preventive care reminders to display for this patient.  Health Maintenance Due  Topic Date Due   DTaP/Tdap/Td (3 - Td or Tdap) 03/03/2022   INFLUENZA VACCINE  08/06/2023      History/P.E. limitations: none  There are no preventive care reminders to display for this patient. There are no preventive care reminders to display for this  patient.  Health Maintenance Due  Topic Date Due   DTaP/Tdap/Td (3 - Td or Tdap) 03/03/2022   INFLUENZA VACCINE  08/06/2023     Chief Complaint  Patient presents with   Medical Management of Chronic Issues    History of Present Illness      SDOH Screenings   Housing: Low Risk  (08/19/2021)  Transportation Needs: No Transportation Needs (08/19/2021)  Depression (PHQ2-9): Low Risk  (08/12/2023)  Tobacco Use: Low Risk  (08/12/2023)   --------------------------------------------------------------------------------------------------------------------------------------------- Visit Problem List with Assessment and Plan   Assessment and Plan Assessment & Plan      No problem-specific Assessment & Plan notes found for this encounter.

## 2023-09-16 DIAGNOSIS — H353221 Exudative age-related macular degeneration, left eye, with active choroidal neovascularization: Secondary | ICD-10-CM | POA: Diagnosis not present

## 2023-09-16 DIAGNOSIS — H43813 Vitreous degeneration, bilateral: Secondary | ICD-10-CM | POA: Diagnosis not present

## 2023-09-16 DIAGNOSIS — H353123 Nonexudative age-related macular degeneration, left eye, advanced atrophic without subfoveal involvement: Secondary | ICD-10-CM | POA: Diagnosis not present

## 2023-09-16 DIAGNOSIS — H353211 Exudative age-related macular degeneration, right eye, with active choroidal neovascularization: Secondary | ICD-10-CM | POA: Diagnosis not present

## 2023-09-16 DIAGNOSIS — H353114 Nonexudative age-related macular degeneration, right eye, advanced atrophic with subfoveal involvement: Secondary | ICD-10-CM | POA: Diagnosis not present

## 2023-10-14 ENCOUNTER — Ambulatory Visit (INDEPENDENT_AMBULATORY_CARE_PROVIDER_SITE_OTHER)

## 2023-10-14 ENCOUNTER — Telehealth: Payer: Self-pay

## 2023-10-14 DIAGNOSIS — Z23 Encounter for immunization: Secondary | ICD-10-CM

## 2023-10-14 NOTE — Progress Notes (Signed)
 Patient presents to nurse clinic for flu and covid vaccines. Vaccines administered without complication. See admin for details.

## 2023-10-14 NOTE — Telephone Encounter (Signed)
 Patient requests an updated handicap placard.   She reported during her nursing visit that hers had expired.  DMV form placed in PCP box for completion.

## 2023-10-19 ENCOUNTER — Encounter: Payer: Self-pay | Admitting: Family Medicine

## 2023-10-19 DIAGNOSIS — H547 Unspecified visual loss: Secondary | ICD-10-CM | POA: Insufficient documentation

## 2023-10-19 NOTE — Progress Notes (Unsigned)
 Completed application for disability parking placard See Media for photo

## 2023-10-19 NOTE — Telephone Encounter (Signed)
 Completed disability parking application placed in RN box in front office.  Please let patient know.

## 2023-10-20 NOTE — Telephone Encounter (Signed)
 Form placed up front for pick up.   Copy made for batch scanning.   Attempted to call patient, however no answer.   Please let her know the form is ready for pick up if/when she calls back.

## 2023-12-20 LAB — OPHTHALMOLOGY REPORT-SCANNED

## 2024-02-21 ENCOUNTER — Encounter
